# Patient Record
Sex: Male | Born: 1935 | Race: White | Hispanic: No | Marital: Married | Smoking: Former smoker
Health system: Southern US, Community
[De-identification: ages and names within clinical notes are randomized; demographics above are authoritative.]

## PROBLEM LIST (undated history)

## (undated) DIAGNOSIS — I10 Essential (primary) hypertension: Secondary | ICD-10-CM

## (undated) DIAGNOSIS — I509 Heart failure, unspecified: Secondary | ICD-10-CM

## (undated) DIAGNOSIS — C439 Malignant melanoma of skin, unspecified: Secondary | ICD-10-CM

## (undated) DIAGNOSIS — J449 Chronic obstructive pulmonary disease, unspecified: Secondary | ICD-10-CM

## (undated) HISTORY — PX: TONSILLECTOMY: SUR1361

## (undated) HISTORY — PX: APPENDECTOMY: SHX54

## (undated) HISTORY — PX: COLON SURGERY: SHX602

---

## 2006-12-07 ENCOUNTER — Ambulatory Visit: Payer: Self-pay | Admitting: Gastroenterology

## 2007-09-08 ENCOUNTER — Ambulatory Visit: Payer: Self-pay | Admitting: Internal Medicine

## 2007-09-20 ENCOUNTER — Ambulatory Visit: Payer: Self-pay | Admitting: Internal Medicine

## 2007-10-03 ENCOUNTER — Ambulatory Visit: Payer: Self-pay | Admitting: Internal Medicine

## 2007-11-24 ENCOUNTER — Ambulatory Visit: Payer: Self-pay | Admitting: Anesthesiology

## 2007-12-06 ENCOUNTER — Ambulatory Visit: Payer: Self-pay | Admitting: Anesthesiology

## 2008-01-08 ENCOUNTER — Ambulatory Visit: Payer: Self-pay | Admitting: Anesthesiology

## 2008-02-08 ENCOUNTER — Ambulatory Visit: Payer: Self-pay | Admitting: Anesthesiology

## 2009-02-27 ENCOUNTER — Emergency Department: Payer: Self-pay | Admitting: Emergency Medicine

## 2009-08-11 ENCOUNTER — Ambulatory Visit: Payer: Self-pay | Admitting: Internal Medicine

## 2009-10-22 ENCOUNTER — Ambulatory Visit: Payer: Self-pay | Admitting: Anesthesiology

## 2009-11-11 ENCOUNTER — Ambulatory Visit: Payer: Self-pay | Admitting: Anesthesiology

## 2009-12-04 ENCOUNTER — Ambulatory Visit: Payer: Self-pay | Admitting: Anesthesiology

## 2009-12-31 ENCOUNTER — Ambulatory Visit: Payer: Self-pay | Admitting: Gastroenterology

## 2010-01-05 ENCOUNTER — Ambulatory Visit: Payer: Self-pay | Admitting: Anesthesiology

## 2010-02-05 ENCOUNTER — Ambulatory Visit: Payer: Self-pay | Admitting: Anesthesiology

## 2010-03-31 ENCOUNTER — Ambulatory Visit: Payer: Self-pay | Admitting: Anesthesiology

## 2010-05-04 ENCOUNTER — Ambulatory Visit: Payer: Self-pay | Admitting: Internal Medicine

## 2010-05-12 ENCOUNTER — Ambulatory Visit: Payer: Self-pay | Admitting: Internal Medicine

## 2010-08-26 ENCOUNTER — Ambulatory Visit: Payer: Self-pay | Admitting: Internal Medicine

## 2013-01-24 ENCOUNTER — Ambulatory Visit: Payer: Self-pay | Admitting: Gastroenterology

## 2013-01-25 ENCOUNTER — Ambulatory Visit: Payer: Self-pay | Admitting: Gastroenterology

## 2013-01-26 LAB — PATHOLOGY REPORT

## 2014-01-23 ENCOUNTER — Inpatient Hospital Stay: Payer: Self-pay | Admitting: Internal Medicine

## 2014-01-23 LAB — CK-MB
CK-MB: 2 ng/mL (ref 0.5–3.6)
CK-MB: 5.9 ng/mL — AB (ref 0.5–3.6)
CK-MB: 6.1 ng/mL — AB (ref 0.5–3.6)

## 2014-01-23 LAB — CBC
HCT: 47.6 % (ref 40.0–52.0)
HGB: 15.9 g/dL (ref 13.0–18.0)
MCH: 30.2 pg (ref 26.0–34.0)
MCHC: 33.4 g/dL (ref 32.0–36.0)
MCV: 90 fL (ref 80–100)
Platelet: 145 10*3/uL — ABNORMAL LOW (ref 150–440)
RBC: 5.27 10*6/uL (ref 4.40–5.90)
RDW: 12.9 % (ref 11.5–14.5)
WBC: 14.7 10*3/uL — ABNORMAL HIGH (ref 3.8–10.6)

## 2014-01-23 LAB — TROPONIN I
Troponin-I: 0.27 ng/mL — ABNORMAL HIGH
Troponin-I: 0.85 ng/mL — ABNORMAL HIGH
Troponin-I: 0.92 ng/mL — ABNORMAL HIGH

## 2014-01-23 LAB — BASIC METABOLIC PANEL
Anion Gap: 14 (ref 7–16)
BUN: 13 mg/dL (ref 7–18)
CHLORIDE: 101 mmol/L (ref 98–107)
CO2: 18 mmol/L — AB (ref 21–32)
Calcium, Total: 8.3 mg/dL — ABNORMAL LOW (ref 8.5–10.1)
Creatinine: 0.96 mg/dL (ref 0.60–1.30)
EGFR (African American): >60
Glucose: 138 mg/dL — ABNORMAL HIGH (ref 65–99)
Osmolality: 269 (ref 275–301)
POTASSIUM: 3.9 mmol/L (ref 3.5–5.1)
Sodium: 133 mmol/L — ABNORMAL LOW (ref 136–145)

## 2014-01-23 LAB — RAPID INFLUENZA A&B ANTIGENS

## 2014-01-24 DIAGNOSIS — I369 Nonrheumatic tricuspid valve disorder, unspecified: Secondary | ICD-10-CM

## 2014-01-24 LAB — BASIC METABOLIC PANEL
Anion Gap: 5 — ABNORMAL LOW (ref 7–16)
BUN: 17 mg/dL (ref 7–18)
Calcium, Total: 8.5 mg/dL (ref 8.5–10.1)
Chloride: 102 mmol/L (ref 98–107)
Co2: 27 mmol/L (ref 21–32)
Creatinine: 1 mg/dL (ref 0.60–1.30)
EGFR (African American): >60
EGFR (Non-African Amer.): >60
Glucose: 150 mg/dL — ABNORMAL HIGH (ref 65–99)
Osmolality: 273 (ref 275–301)
Potassium: 4.1 mmol/L (ref 3.5–5.1)
Sodium: 134 mmol/L — ABNORMAL LOW (ref 136–145)

## 2014-01-24 LAB — CBC WITH DIFFERENTIAL/PLATELET
BASOS ABS: 0 10*3/uL (ref 0.0–0.1)
BASOS PCT: 0 %
EOS ABS: 0 10*3/uL (ref 0.0–0.7)
EOS PCT: 0 %
HCT: 42 % (ref 40.0–52.0)
HGB: 14.5 g/dL (ref 13.0–18.0)
LYMPHS PCT: 6.2 %
Lymphocyte #: 1.1 10*3/uL (ref 1.0–3.6)
MCH: 30.7 pg (ref 26.0–34.0)
MCHC: 34.4 g/dL (ref 32.0–36.0)
MCV: 89 fL (ref 80–100)
MONO ABS: 0.4 x10 3/mm (ref 0.2–1.0)
Monocyte %: 2 %
NEUTROS ABS: 16.5 10*3/uL — AB (ref 1.4–6.5)
NEUTROS PCT: 91.8 %
PLATELETS: 141 10*3/uL — AB (ref 150–440)
RBC: 4.71 10*6/uL (ref 4.40–5.90)
RDW: 13 % (ref 11.5–14.5)
WBC: 18 10*3/uL — ABNORMAL HIGH (ref 3.8–10.6)

## 2014-01-24 LAB — MAGNESIUM: Magnesium: 1.7 mg/dL — ABNORMAL LOW

## 2014-01-25 LAB — BASIC METABOLIC PANEL
Anion Gap: 8 (ref 7–16)
BUN: 26 mg/dL — AB (ref 7–18)
Calcium, Total: 8.2 mg/dL — ABNORMAL LOW (ref 8.5–10.1)
Chloride: 102 mmol/L (ref 98–107)
Co2: 25 mmol/L (ref 21–32)
Creatinine: 1.08 mg/dL (ref 0.60–1.30)
EGFR (Non-African Amer.): >60
GLUCOSE: 148 mg/dL — AB (ref 65–99)
Osmolality: 278 (ref 275–301)
POTASSIUM: 3.9 mmol/L (ref 3.5–5.1)
Sodium: 135 mmol/L — ABNORMAL LOW (ref 136–145)

## 2014-01-25 LAB — CBC
HCT: 43.5 % (ref 40.0–52.0)
HGB: 14.5 g/dL (ref 13.0–18.0)
MCH: 29.8 pg (ref 26.0–34.0)
MCHC: 33.2 g/dL (ref 32.0–36.0)
MCV: 90 fL (ref 80–100)
Platelet: 177 10*3/uL (ref 150–440)
RBC: 4.86 10*6/uL (ref 4.40–5.90)
RDW: 13.3 % (ref 11.5–14.5)
WBC: 19.5 10*3/uL — AB (ref 3.8–10.6)

## 2014-01-26 LAB — WBC: WBC: 12.9 10*3/uL — AB (ref 3.8–10.6)

## 2014-01-26 LAB — BASIC METABOLIC PANEL
ANION GAP: 3 — AB (ref 7–16)
BUN: 33 mg/dL — ABNORMAL HIGH (ref 7–18)
CALCIUM: 7.9 mg/dL — AB (ref 8.5–10.1)
CO2: 28 mmol/L (ref 21–32)
Chloride: 105 mmol/L (ref 98–107)
Creatinine: 1.02 mg/dL (ref 0.60–1.30)
EGFR (African American): >60
EGFR (Non-African Amer.): >60
Glucose: 163 mg/dL — ABNORMAL HIGH (ref 65–99)
Osmolality: 283 (ref 275–301)
Potassium: 4.1 mmol/L (ref 3.5–5.1)
Sodium: 136 mmol/L (ref 136–145)

## 2014-01-28 LAB — CULTURE, BLOOD (SINGLE)

## 2014-09-29 ENCOUNTER — Inpatient Hospital Stay: Payer: Self-pay | Admitting: Internal Medicine

## 2014-09-29 LAB — CK TOTAL AND CKMB (NOT AT ARMC)
CK, TOTAL: 46 U/L
CK, Total: 54 U/L
CK, Total: 51 U/L
CK-MB: 0.7 ng/mL (ref 0.5–3.6)
CK-MB: 0.8 ng/mL (ref 0.5–3.6)
CK-MB: 0.9 ng/mL (ref 0.5–3.6)

## 2014-09-29 LAB — COMPREHENSIVE METABOLIC PANEL
ALBUMIN: 3.4 g/dL (ref 3.4–5.0)
ALT: 28 U/L
Alkaline Phosphatase: 73 U/L
Anion Gap: 6 — ABNORMAL LOW (ref 7–16)
BUN: 14 mg/dL (ref 7–18)
Bilirubin,Total: 1 mg/dL (ref 0.2–1.0)
CALCIUM: 8.3 mg/dL — AB (ref 8.5–10.1)
CO2: 29 mmol/L (ref 21–32)
Chloride: 101 mmol/L (ref 98–107)
Creatinine: 1.2 mg/dL (ref 0.60–1.30)
EGFR (Non-African Amer.): >60
Glucose: 136 mg/dL — ABNORMAL HIGH (ref 65–99)
Osmolality: 275 (ref 275–301)
Potassium: 4 mmol/L (ref 3.5–5.1)
SGOT(AST): 28 U/L (ref 15–37)
SODIUM: 136 mmol/L (ref 136–145)
TOTAL PROTEIN: 6.8 g/dL (ref 6.4–8.2)

## 2014-09-29 LAB — CBC
HCT: 50.1 % (ref 40.0–52.0)
HGB: 16 g/dL (ref 13.0–18.0)
MCH: 29 pg (ref 26.0–34.0)
MCHC: 31.9 g/dL — ABNORMAL LOW (ref 32.0–36.0)
MCV: 91 fL (ref 80–100)
Platelet: 181 10*3/uL (ref 150–440)
RBC: 5.51 10*6/uL (ref 4.40–5.90)
RDW: 15.8 % — ABNORMAL HIGH (ref 11.5–14.5)
WBC: 11.5 10*3/uL — ABNORMAL HIGH (ref 3.8–10.6)

## 2014-09-29 LAB — TROPONIN I
Troponin-I: <0.02 ng/mL
Troponin-I: <0.02 ng/mL

## 2014-09-29 LAB — PRO B NATRIURETIC PEPTIDE: B-TYPE NATIURETIC PEPTID: 288 pg/mL (ref 0–450)

## 2014-09-30 LAB — CBC WITH DIFFERENTIAL/PLATELET
Basophil #: 0 10*3/uL (ref 0.0–0.1)
Basophil %: 0.1 %
Eosinophil #: 0 10*3/uL (ref 0.0–0.7)
Eosinophil %: 0 %
HCT: 46.2 % (ref 40.0–52.0)
HGB: 15.1 g/dL (ref 13.0–18.0)
Lymphocyte #: 1.3 10*3/uL (ref 1.0–3.6)
Lymphocyte %: 8.4 %
MCH: 29.1 pg (ref 26.0–34.0)
MCHC: 32.7 g/dL (ref 32.0–36.0)
MCV: 89 fL (ref 80–100)
Monocyte #: 0.5 x10 3/mm (ref 0.2–1.0)
Monocyte %: 3.6 %
NEUTROS ABS: 13.2 10*3/uL — AB (ref 1.4–6.5)
Neutrophil %: 87.9 %
Platelet: 186 10*3/uL (ref 150–440)
RBC: 5.18 10*6/uL (ref 4.40–5.90)
RDW: 15.6 % — ABNORMAL HIGH (ref 11.5–14.5)
WBC: 15.1 10*3/uL — ABNORMAL HIGH (ref 3.8–10.6)

## 2014-09-30 LAB — BASIC METABOLIC PANEL
ANION GAP: 8 (ref 7–16)
BUN: 14 mg/dL (ref 7–18)
CHLORIDE: 103 mmol/L (ref 98–107)
CO2: 26 mmol/L (ref 21–32)
Calcium, Total: 8.1 mg/dL — ABNORMAL LOW (ref 8.5–10.1)
Creatinine: 1.1 mg/dL (ref 0.60–1.30)
Glucose: 130 mg/dL — ABNORMAL HIGH (ref 65–99)
Osmolality: 276 (ref 275–301)
Potassium: 4.3 mmol/L (ref 3.5–5.1)
Sodium: 137 mmol/L (ref 136–145)

## 2014-10-01 LAB — CBC WITH DIFFERENTIAL/PLATELET
Basophil #: 0 10*3/uL (ref 0.0–0.1)
Basophil %: 0.1 %
EOS ABS: 0 10*3/uL (ref 0.0–0.7)
Eosinophil %: 0 %
HCT: 43.9 % (ref 40.0–52.0)
HGB: 14.1 g/dL (ref 13.0–18.0)
LYMPHS PCT: 5.8 %
Lymphocyte #: 1.1 10*3/uL (ref 1.0–3.6)
MCH: 28.7 pg (ref 26.0–34.0)
MCHC: 32 g/dL (ref 32.0–36.0)
MCV: 90 fL (ref 80–100)
MONO ABS: 0.4 x10 3/mm (ref 0.2–1.0)
Monocyte %: 2.1 %
NEUTROS ABS: 17.1 10*3/uL — AB (ref 1.4–6.5)
Neutrophil %: 92 %
Platelet: 193 10*3/uL (ref 150–440)
RBC: 4.9 10*6/uL (ref 4.40–5.90)
RDW: 15.5 % — AB (ref 11.5–14.5)
WBC: 18.6 10*3/uL — AB (ref 3.8–10.6)

## 2014-10-02 LAB — CBC WITH DIFFERENTIAL/PLATELET
BASOS PCT: 0.1 %
Basophil #: 0 10*3/uL (ref 0.0–0.1)
Eosinophil #: 0 10*3/uL (ref 0.0–0.7)
Eosinophil %: 0 %
HCT: 44.5 % (ref 40.0–52.0)
HGB: 14 g/dL (ref 13.0–18.0)
LYMPHS ABS: 0.8 10*3/uL — AB (ref 1.0–3.6)
Lymphocyte %: 5 %
MCH: 28.5 pg (ref 26.0–34.0)
MCHC: 31.4 g/dL — ABNORMAL LOW (ref 32.0–36.0)
MCV: 91 fL (ref 80–100)
MONO ABS: 0.5 x10 3/mm (ref 0.2–1.0)
MONOS PCT: 3 %
Neutrophil #: 14.6 10*3/uL — ABNORMAL HIGH (ref 1.4–6.5)
Neutrophil %: 91.9 %
Platelet: 208 10*3/uL (ref 150–440)
RBC: 4.91 10*6/uL (ref 4.40–5.90)
RDW: 15.5 % — ABNORMAL HIGH (ref 11.5–14.5)
WBC: 15.9 10*3/uL — ABNORMAL HIGH (ref 3.8–10.6)

## 2014-10-02 LAB — MAGNESIUM: MAGNESIUM: 1.9 mg/dL

## 2014-10-03 LAB — BASIC METABOLIC PANEL
Anion Gap: 6 — ABNORMAL LOW (ref 7–16)
BUN: 24 mg/dL — AB (ref 7–18)
CO2: 30 mmol/L (ref 21–32)
Calcium, Total: 7.9 mg/dL — ABNORMAL LOW (ref 8.5–10.1)
Chloride: 100 mmol/L (ref 98–107)
Creatinine: 0.99 mg/dL (ref 0.60–1.30)
EGFR (African American): >60
Glucose: 145 mg/dL — ABNORMAL HIGH (ref 65–99)
Osmolality: 279 (ref 275–301)
Potassium: 4 mmol/L (ref 3.5–5.1)
Sodium: 136 mmol/L (ref 136–145)

## 2014-10-04 LAB — CULTURE, BLOOD (SINGLE)

## 2015-01-14 ENCOUNTER — Inpatient Hospital Stay: Payer: Self-pay | Admitting: Internal Medicine

## 2015-01-14 LAB — CBC
HCT: 48.6 % (ref 40.0–52.0)
HGB: 16 g/dL (ref 13.0–18.0)
MCH: 29.4 pg (ref 26.0–34.0)
MCHC: 32.8 g/dL (ref 32.0–36.0)
MCV: 90 fL (ref 80–100)
PLATELETS: 192 10*3/uL (ref 150–440)
RBC: 5.43 10*6/uL (ref 4.40–5.90)
RDW: 14.4 % (ref 11.5–14.5)
WBC: 10.8 10*3/uL — AB (ref 3.8–10.6)

## 2015-01-14 LAB — BASIC METABOLIC PANEL
Anion Gap: 6 — ABNORMAL LOW (ref 7–16)
BUN: 13 mg/dL (ref 7–18)
CALCIUM: 8.8 mg/dL (ref 8.5–10.1)
Chloride: 99 mmol/L (ref 98–107)
Co2: 30 mmol/L (ref 21–32)
Creatinine: 1.12 mg/dL (ref 0.60–1.30)
GLUCOSE: 148 mg/dL — AB (ref 65–99)
Osmolality: 273 (ref 275–301)
Potassium: 4.1 mmol/L (ref 3.5–5.1)
Sodium: 135 mmol/L — ABNORMAL LOW (ref 136–145)

## 2015-01-14 LAB — TROPONIN I: Troponin-I: <0.02 ng/mL

## 2015-01-15 LAB — CBC WITH DIFFERENTIAL/PLATELET
BASOS PCT: 0.1 %
Basophil #: 0 10*3/uL (ref 0.0–0.1)
EOS PCT: 0 %
Eosinophil #: 0 10*3/uL (ref 0.0–0.7)
HCT: 45.8 % (ref 40.0–52.0)
HGB: 14.8 g/dL (ref 13.0–18.0)
Lymphocyte #: 0.8 10*3/uL — ABNORMAL LOW (ref 1.0–3.6)
Lymphocyte %: 11 %
MCH: 28.8 pg (ref 26.0–34.0)
MCHC: 32.4 g/dL (ref 32.0–36.0)
MCV: 89 fL (ref 80–100)
Monocyte #: 0.1 x10 3/mm — ABNORMAL LOW (ref 0.2–1.0)
Monocyte %: 1.7 %
NEUTROS PCT: 87.2 %
Neutrophil #: 6.4 10*3/uL (ref 1.4–6.5)
Platelet: 185 10*3/uL (ref 150–440)
RBC: 5.14 10*6/uL (ref 4.40–5.90)
RDW: 14.1 % (ref 11.5–14.5)
WBC: 7.3 10*3/uL (ref 3.8–10.6)

## 2015-01-15 LAB — BASIC METABOLIC PANEL
Anion Gap: 9 (ref 7–16)
BUN: 15 mg/dL (ref 7–18)
CHLORIDE: 100 mmol/L (ref 98–107)
CREATININE: 1.09 mg/dL (ref 0.60–1.30)
Calcium, Total: 8.8 mg/dL (ref 8.5–10.1)
Co2: 27 mmol/L (ref 21–32)
EGFR (African American): >60
EGFR (Non-African Amer.): >60
GLUCOSE: 142 mg/dL — AB (ref 65–99)
Osmolality: 275 (ref 275–301)
Potassium: 4.3 mmol/L (ref 3.5–5.1)
SODIUM: 136 mmol/L (ref 136–145)

## 2015-01-15 LAB — MAGNESIUM: MAGNESIUM: 2 mg/dL

## 2015-02-22 ENCOUNTER — Emergency Department: Payer: Self-pay | Admitting: Emergency Medicine

## 2015-03-22 ENCOUNTER — Emergency Department
Admit: 2015-03-22 | Disposition: A | Discharge status: Home / Self Care | Payer: Self-pay | Admitting: Emergency Medicine

## 2015-03-22 LAB — BASIC METABOLIC PANEL
Anion Gap: 9 (ref 7–16)
BUN: 16 mg/dL
CALCIUM: 8.6 mg/dL — AB
Chloride: 100 mmol/L — ABNORMAL LOW
Co2: 26 mmol/L
Creatinine: 0.99 mg/dL
EGFR (African American): >60
GLUCOSE: 134 mg/dL — AB
Potassium: 3.6 mmol/L
SODIUM: 135 mmol/L

## 2015-03-22 LAB — CBC
HCT: 43.6 % (ref 40.0–52.0)
HGB: 15.3 g/dL (ref 13.0–18.0)
MCH: 32.7 pg (ref 26.0–34.0)
MCHC: 35.1 g/dL (ref 32.0–36.0)
MCV: 93 fL (ref 80–100)
Platelet: 234 10*3/uL (ref 150–440)
RBC: 4.68 10*6/uL (ref 4.40–5.90)
RDW: 16 % — ABNORMAL HIGH (ref 11.5–14.5)
WBC: 6.3 10*3/uL (ref 3.8–10.6)

## 2015-03-22 LAB — PRO B NATRIURETIC PEPTIDE: B-Type Natriuretic Peptide: 134 pg/mL — ABNORMAL HIGH

## 2015-03-22 LAB — TROPONIN I

## 2015-04-12 NOTE — Consult Note (Signed)
PATIENT NAME:  Brett Burton, Brett Burton MR#:  [id number] DATE OF BIRTH:  08/07/1929  DATE OF CONSULTATION:  09/29/2014  CONSULTING PHYSICIAN:    Dyann Ruddle A. Dorena Cookey, PA-C for Dionisio David, MD  INDICATION FOR CONSULTATION: Shortness of breath.  HISTORY OF PRESENT ILLNESS:  This is a pleasant 81 year old male known to our practice, who presented to the ER this morning with shortness of breath and cough x 3-4 days, which he relates to getting his flu shot on Wednesday.  He denies chest pain, palpitations, tachycardia, orthopnea, PND, or edema.  He admits to being compliant with all medications prescribed.    PAST MEDICAL HISTORY:  Coronary artery disease diagnosed in February 2015, currently managed medically, atrial fibrillation, mildly decreased left ventricular systolic function, COPD, hypertension and hyperlipidemia.    SOCIAL HISTORY:  The patient admits to EtOH abuse, quit 50 years ago.  He also admits to smoking cigarettes, quit 20 years ago. He admits to currently using smokeless tobacco occasionally. He denies any other illicit drug use.  FAMILY HISTORY:  Positive for coronary artery disease in father and 3 brothers who all had myocardial infarctions in their 85s.    REVIEW OF SYSTEMS:  CONSTITUTIONAL:  No fatigue, malaise, or dizziness. RESPIRATORY:  He denies current shortness of breath or cough.  CARDIOVASCULAR:  He denies chest pain, pressure, tightness, palpitations, orthopnea or PND.  ABDOMINAL:  No abdominal pain.  PHYSICAL EXAMINATION:  VITAL SIGNS:  Temperature 98.1, pulse 72, respirations 18, blood pressure 151/79.  Pulse oximetry 88% on 6 liters nasal cannula.   GENERAL:  The patient is alert and oriented x 3 in no acute distress.  Nasal cannula in place, sitting comfortably in bed.   RESPIRATORY:  Decreased air entry, mild wheezes and rhonchi bilaterally. No crackles.  HEART:  Normal S1, S2, no audible murmur. ABDOMEN:  No hepatomegaly.  No abdominal tenderness.  Positive bowel  sounds.   EXTREMITIES:  No pedal edema.    LABORATORY DATA: BNP of 288, creatinine 1.2, troponin negative x 2.   EKG shows normal sinus rhythm, 69 beats per minute, LAE, nonspecific ST-T and T changes.   ASSESSMENT AND PLAN:  Shortness of breath: Await echocardiogram results.  The patient had echocardiogram done in August and May of 2015 which shows ejection fraction of 89%, grade 1 diastolic dysfunction, mild mitral regurgitation, tricuspid regurgitation, mild to moderate pulmonary insufficiency.  Shortness of breath more likely secondary to chronic obstructive pulmonary disease as BNP, troponins and physical examination were negative for congestive heart failure symptoms besides shortness of breath.   Will follow.  Thank you very much for this consultation.   ____________________________ Kelby Fam [place] ear:LT D: 09/29/2014 16:20:58 ET T: 09/29/2014 17:35:16 ET JOB#: [id number]  cc: Dyann Ruddle A. Baldwin [place], <Dictator> Kelby Fam Latona Krichbaum PA ELECTRONICALLY SIGNED 10/02/2014 12:31

## 2015-04-12 NOTE — Discharge Summary (Signed)
PATIENT NAME:  Brett Burton, Brett Burton MR#:  [id number] DATE OF BIRTH:  Jan 20, 1929  DATE OF ADMISSION:  09/29/2014 DATE OF DISCHARGE:  10/04/2014  DISCHARGE DIAGNOSES:  1.  Acute on chronic respiratory failure.  2.  Chronic obstructive pulmonary disease exacerbation.  3.  Acute on chronic diastolic congestive heart failure.  4.  Hypertension.  5.  Paroxysmal atrial fibrillation.   CONSULTATIONS: Neoma Laming, MD with cardiology.   DIAGNOSTIC DATA:  1.  Chest x-ray showed pulmonary edema bilaterally. 2.  Echocardiogram showed ejection fraction of 50-55% with infarct of the inferior segment and mid inferior segment abnormal, impaired relaxation pattern suggesting diastolic failure.   ADMITTING HISTORY AND PHYSICAL: Please see detailed H and P dictated by Dr. Doy Hutching. In brief, this is an 81 year old male patient brought into the hospital complaining of acute on chronic shortness of breath. The patient was found to have COPD exacerbation along with CHF and was admitted to the hospitalist service.   HOSPITAL COURSE: Acute on chronic respiratory failure secondary to chronic obstructive pulmonary disease exacerbation and congestive heart failure exacerbation. The patient was started on scheduled nebulizers, IV steroids and oxygen support, with which he improved well. His wheezing disappeared, but the patient breathing did not improve completely. The patient was later started on Lasix 40 IV b.i.d. with which he diuresed well, and his oxygen requirements have come down from 6 L to 3 L by the time of discharge.   Prior to discharge, the patient's lungs sound clear. No wheezing, no crackles, no edema. The patient feels back to baseline, and has requested to be discharged home. He was discharged home in a stable condition.   DISCHARGE MEDICATIONS:  1.  Combivent inhaled 3 times a day as needed.  2.  Calcium carbonate 500 mg 2 times a day.  3.  Isosorbide mononitrate 30 mg daily.  4.  Amiodarone 200 mg daily.   5.  Simvastatin 20 mg daily.  6.  Aspirin 325 mg daily.  7.  Plavix 75 mg daily.  8.  Fluticasone salmeterol 500/50 mg 1 puff b.i.d.  9.  Spiriva 12 mcg inhaled once a day.  10.  Amlodipine 5 mg daily.  11.  Coreg 3.125 mg 2 times a day.  12.  Lasix 40 mg daily.  13.  Potassium chloride 10 mEq daily.  14.  Prednisone 60 mg taper over 6 days.   DISCHARGE INSTRUCTIONS: Home oxygen continuously 3 L through nasal cannula. Low-sodium, low-fat diet. Daily fluids less than 2 L. Activity as tolerated. Follow up with primary care physician, Dr. Thereasa Distance, in 1-2 weeks. The patient has been instructed to check his weight every day, keep a log and take an extra dose of Lasix if he gains more than 3 pounds.   TIME SPENT ON DAY OF DISCHARGE IN DISCHARGE ACTIVITY: 40 minutes    ____________________________ Leia Alf. Tkai Large, MD srs:MT D: 10/07/2014 13:55:05 ET T: 10/07/2014 15:42:40 ET JOB#: [id number]  cc: Alveta Heimlich R. Darvin Neighbours, MD, <Dictator> Dionisio David, MD Sofie Hartigan, MD Neita Carp MD ELECTRONICALLY SIGNED 10/16/2014 10:44

## 2015-04-12 NOTE — Consult Note (Signed)
PATIENT NAME:  Brett Burton, Brett Burton MR#:  [id number] DATE OF BIRTH:  1929/02/12  CARDIOLOGY CONSULTATION   DATE OF CONSULTATION:  01/24/2014  CONSULTING PHYSICIAN:  Dionisio David, MD  INDICATION FOR CONSULTATION: Elevated troponin and atrial fibrillation.   HISTORY OF PRESENT ILLNESS: This is an 81 year old white male with a past medical history of COPD, who, according to the son and the patient, has been feeling short of breath with URI type of symptoms for 2 or 3 weeks. Yesterday, all of a sudden, developed severe shortness of breath to the point he could not sleep. Thus, he called ambulance and was brought to the hospital. In the hospital, he was treated for possible CHF and pneumonia, and incidental finding, his troponin initially was 0.37 and then went up to 0.92. Thus, I was asked to evaluate the patient. The patient denies any chest pain, but has been having severe shortness of breath.   PAST MEDICAL HISTORY: He has a history of hypertension, history of COPD. No history of prior coronary artery disease, CHF or any kind of cardiac problem.   SOCIAL HISTORY: Quit smoking a long time ago. No history of EtOH abuse.   FAMILY HISTORY: Positive for coronary artery disease and MI. Father had MI.   PHYSICAL EXAMINATION:  GENERAL: He is alert, oriented x3, in mild distress due to shortness of breath.  VITAL SIGNS: He is on monitor, and he is showing atrial fibrillation, heart rate 120, respirations are 18, blood pressure is 130/70.  NECK: Revealed positive JVD.  LUNGS: Good air entry, occasional wheezing.   HEART: Irregularly irregular. Normal S1, S2. No audible murmur.  ABDOMEN: Soft, nontender, positive bowel sounds.  EXTREMITIES: No pedal edema.   DIAGNOSTIC STUDIES: EKG on admission showed sinus tachycardia, left atrial enlargement, right axis deviation, evidence of COPD. Troponin was 0.92. BUN and creatinine appear to be normal.   ASSESSMENT AND PLAN: The patient has elevated troponin,  congestive heart failure and atrial fibrillation. Repeat EKG shows atrial fibrillation with rapid ventricular response rate.   PLAN: Start the patient on IV amiodarone as his EKG shows prolonged QTc interval. Also, will replace magnesium with 2 grams magnesium IV. Also, because of elevated troponin, probably has coronary artery disease. Schedule cardiac catheterization.   Thank you very much for referral.   ____________________________ Dionisio David, MD sak:lb D: 01/24/2014 15:02:51 ET T: 01/24/2014 15:12:00 ET JOB#: [id number]  cc: Dionisio David, MD, <Dictator> Dionisio David MD ELECTRONICALLY SIGNED 02/01/2014 8:39

## 2015-04-12 NOTE — H&P (Signed)
PATIENT NAME:  Brett Burton, Brett Burton MR#:  [id number] DATE OF BIRTH:  1929-05-14  DATE OF ADMISSION:  09/29/2014  REFERRING PHYSICIAN: Latina Craver, M.D.   FAMILY PHYSICIAN: Sofie Hartigan, M.D. in [place].  REASON FOR ADMISSION: Acute on chronic respiratory failure.   HISTORY OF PRESENT ILLNESS: The patient is an 81 year old male with a significant history of COPD on oxygen at home. Also has a history of 2-vessel coronary artery disease being medically managed, hypertension, and a remote history of colon cancer. Presents to the Emergency Room with a 2 to 3 day history of worsening shortness of breath. In the Emergency Room, the patient's oxygen requirements were 4 to 5 liters of oxygen with exertion. The patient denies chest pain. Chest x-ray done in the ER suggests CHF, although the patient's BNP is normal. Denies history of heart failure. On amiodarone for unknown reasons as he denies any cardiac arrhythmia. He is now admitted for further evaluation.   PAST MEDICAL HISTORY: 1. Two-vessel coronary artery disease being medically managed.  2. Chronic respiratory failure, on oxygen.  3. COPD.  4. Benign hypertension.  5. Hyperlipidemia.  6. Remote history of colon cancer.   MEDICATIONS:  1. Spiriva 1 capsule inhaled daily.  2. Crestor 20 mg p.o. daily.  3. Imdur 30 mg p.o. daily.  4. Advair 500/50 one puff b.i.d.  5. Combivent 1 puff t.i.d.  6. Plavix 75 mg p.o. daily.  7. Coreg 3.125 mg p.o. b.i.d.  8. Aspirin 325 mg p.o. daily.  9. Amlodipine 5 mg p.o. daily.  10. Amiodarone 200 mg p.o. daily.   ALLERGIES: No known drug allergies.   SOCIAL HISTORY: The patient is a former smoker. Denies alcohol abuse.   FAMILY HISTORY: Positive for coronary artery disease, but otherwise unremarkable.   REVIEW OF SYSTEMS:  CONSTITUTIONAL: No fever or change in weight.  EYES: No blurred or double vision. No glaucoma.  ENT: No tinnitus or hearing loss. No nasal discharge or bleeding. No difficulty  swallowing.  RESPIRATORY: The patient has had cough, but denies wheezing or hemoptysis. No painful respiration.  CARDIOVASCULAR: No chest pain or orthopnea. No palpitations or syncope.  GASTROINTESTINAL: No nausea, vomiting, or diarrhea. Denies abdominal pain.  GENITOURINARY: No dysuria or hematuria. No incontinence.  ENDOCRINE: No polyuria or polydipsia. No heat or cold intolerance.  HEMATOLOGIC: The patient denies anemia, easy bruising, or bleeding.  LYMPHATIC: No swollen glands.  MUSCULOSKELETAL: The patient denies pain in his neck, back, shoulders, knees, or hips. No gout.  NEUROLOGIC: No numbness or migraines. Denies stroke or seizures.  PSYCHIATRIC: The patient denies anxiety, insomnia or depression.   PHYSICAL EXAMINATION:  GENERAL: The patient is in no acute distress.  VITAL SIGNS: Remarkable for a blood pressure of 158/76, heart rate 72, respiratory rate 22, temperature 98.1, saturation 82% on room air, saturations 98% on 4 liters.  HEENT: Normocephalic, atraumatic. Pupils equal, round, and reactive to light and accommodation. Extraocular movements are intact. Sclerae anicteric. Conjunctivae are clear. Oropharynx is clear.  NECK: Supple without JVD. No adenopathy or thyromegaly is noted.  LUNGS: Revealed scattered rhonchi without wheezes or rales. No dullness. Respiratory effort is mildly increased.  CARDIAC: Regular rate and rhythm. Normal S1, S2. No significant rubs, murmurs or gallops. PMI is nondisplaced. Chest wall is nontender.  ABDOMEN: Soft, nontender, with normoactive bowel sounds. No organomegaly or masses were appreciated. No hernias or bruits were noted.  EXTREMITIES: Without clubbing, cyanosis or edema. Pulses were 2+ bilaterally.  SKIN: Warm and dry without  rash or lesions.  NEUROLOGIC: Cranial nerves II through XII grossly intact. Deep tendon reflexes were symmetric. Motor and sensory examination is nonfocal.  PSYCHIATRIC: Revealed a patient who is alert and oriented to  person, place and time. He was cooperative and used good judgment.   LABORATORY DATA: EKG revealed sinus rhythm with a right bundle branch block, but no acute ischemic changes.   Chest x-ray revealed moderate CHF superimposed on COPD with diffuse interstitial pulmonary edema and small bilateral pleural effusions.   His BNP was normal at 288. Troponin less than 0.02. Glucose 136 with a BUN of 14, creatinine 1.20 and a GFR of greater than 60. His white count was 11.5 with a hemoglobin of 16.   ASSESSMENT:  1. Acute on chronic respiratory failure.  2. Chronic obstructive pulmonary disease exacerbation.  3. Questionable presumably diastolic congestive heart failure.  4. Two-vessel coronary artery disease being medically managed.  5. Hyperlipidemia.  6. Benign hypertension.   PLAN: The patient will be admitted to telemetry as a Full Code. We will begin oxygen, IV steroids, IV antibiotics and DuoNeb SVNs. Will obtain a CT of the chest to rule out heart failure given the fact that his examination is unremarkable for congestive heart failure. May have some interstitial lung disease from amiodarone use. We will follow serial cardiac enzymes and obtain an echocardiogram. We will consult pulmonology and cardiology. Wean oxygen as tolerated. Follow his sugars while on steroids. Follow up routine laboratories in the morning.   Further treatment and evaluation will depend upon the patient's progress.   TOTAL TIME SPENT ON THIS PATIENT: 50 minutes.    ____________________________ Leonie Douglas Doy Hutching, MD jds:JT D: 09/29/2014 11:31:45 ET T: 09/29/2014 12:13:42 ET JOB#: [id number]  cc: Leonie Douglas. Doy Hutching, MD, <Dictator> Sofie Hartigan, MD Aneliese Beaudry Lennice Sites MD ELECTRONICALLY SIGNED 09/29/2014 20:28

## 2015-04-12 NOTE — H&P (Signed)
PATIENT NAME:  Brett Burton, Brett Burton MR#:  [id number] DATE OF BIRTH:  Sep 09, 1929  DATE OF ADMISSION:  01/23/2014  REFERRING PHYSICIAN:  Dr. Dahlia Client.   PRIMARY CARE PHYSICIAN:  Dr. Ellison Hughs.   CHIEF COMPLAINT:  Shortness of breath.  HISTORY OF PRESENT ILLNESS:  An 81 year old gentleman with history of COPD on 79 liters nasal cannula at nighttime at baseline, hypertension, hyperlipidemia, presenting with shortness of breath.  Described shortness of breath in total of two weeks duration originally started with URI-like symptoms, however progressively worsening mainly as dyspnea on exertion, acutely worse today.  Said he now has associated fevers, chills about one day duration with cough which is nonproductive in nature.  Denies any chest pain, palpitations, edema, orthopnea or PND.  Currently he is without complaints, however he was hypoxemic on arrival to the Emergency Department requiring supplemental O2.  REVIEW OF SYSTEMS:  CONSTITUTIONAL:  Positive for fevers, chills.  Denies any fatigue, weakness, pain.  EYES:  Denies blurred vision, double vision, eye pain.  EARS, NOSE, THROAT:  Denies tinnitus, ear pain, hearing loss.  RESPIRATORY:  Positive for nonproductive cough as described above as well as dyspnea on exertion; however denies any wheeze.  CARDIOVASCULAR:  Denies chest pain, palpitations, edema.  GASTROINTESTINAL:  Denies nausea, vomiting, diarrhea, abdominal pain.  GENITOURINARY:  Denies dysuria, hematuria.  ENDOCRINE:  Denies nocturia or thyroid problems.  HEMATOLOGY AND LYMPHATIC:  Denies easy bruising, bleeding.  SKIN:  Denies rash or lesion.  MUSCULOSKELETAL:  Denies pain in neck, back, shoulder, knees, hips, arthritic symptoms.  NEUROLOGIC:  Denies paralysis, paresthesias.  PSYCHIATRIC:  Denies anxiety or depressive symptoms.  Otherwise, full review of systems performed by me is negative.   PAST MEDICAL HISTORY:  COPD requiring 2 liters nasal cannula at nighttime only, hypertension,  hyperlipidemia, history of colon cancer as well.   SOCIAL HISTORY:  Remote tobacco abuse, occasional chewing tobacco.  Denies any alcohol use or drug usage.   FAMILY HISTORY:  Denies any known cardiac or pulmonary diseases.   ALLERGIES:  No known drug allergies.   HOME MEDICATIONS:  Include Combivent 3 times daily as needed for shortness of breath, Dulera inhaled 2 times daily, Spiriva inhaled daily, Norvasc 5 mg by mouth daily, aspirin 81 mg by mouth daily, pravastatin 40 mg by mouth daily.   PHYSICAL EXAMINATION: VITAL SIGNS:  Temperature 99, heart rate 110, respirations 24, blood pressure 163/80, saturating 85% on room air.  Weight 77.1 kg, BMI of 24.4.  GENERAL:  Well-nourished, well-developed Caucasian gentleman, currently in no acute distress.  HEAD:  Normocephalic, atraumatic.  EYES:  Pupils equal, round, reactive to light.  Extraocular muscles intact.  No scleral icterus.  MOUTH:  Moist mucosal membranes.  Dentition intact.  No abscess noted.  EAR, NOSE, THROAT:  Throat clear without exudates.  No external lesions.  NECK:  Supple.  No thyromegaly.  No nodules.  No JVD.  PULMONARY:  Diffuse coarse breath sounds with bibasilar rhonchi and prolonged expiratory phase without wheezing.  No use of accessory muscles, tachypneic, good respiratory effort.  CHEST:  Nontender to palpation.  CARDIOVASCULAR:  S1, S2, tachycardic.  No murmurs, rubs, or gallops.  No edema.  Pedal pulses 2+ bilaterally. GASTROINTESTINAL:  Soft, nontender, nondistended.  No masses.  Positive bowel sounds.  No hepatosplenomegaly.  MUSCULOSKELETAL:  No swelling, clubbing, edema.  Range of motion full in all extremities.  NEUROLOGICAL:  Cranial nerves II through XII intact.  No gross neurological deficits.  Sensation intact.  Reflexes intact.  SKIN:  No ulcerations, lesions, rashes, cyanosis.  Skin warm, dry.  Turgor intact.  PSYCHIATRIC:  Mood and affect within normal limits.  The patient is awake, alert and oriented x  3.  Insight and judgment intact.   LABORATORY DATA:  Sinus tachycardia without ST or T wave abnormalities.  Chest x-ray performed revealing hyperinflated lung fields, small effusion on the left lower lobe with compressive atelectasis as well as multilevel interstitial infiltrate.  Sodium 133, potassium 3.9, chloride 101, bicarb 18, anion gap of 14, BUN 13, creatinine 0.96, glucose 138.  Troponin I 0.27.  WBC 14.7, hemoglobin 15.9.   ASSESSMENT AND PLAN:  An 81 year old gentleman with history of chronic obstructive pulmonary disease on supplemental O2 at nighttime presenting with shortness of breath.  1.  Sepsis meeting septic criteria by heart rate, respiratory rate and leukocytosis secondary to community-acquired pneumonia.  Admit, panculture including blood and sputum.  Antibiotic coverage with ceftriaxone and azithromycin.  Provide DuoNeb therapy q. 4 hours, supplemental O2 to keep oxygen saturation greater than 92%, flutter valve therapy.  2.  Elevated troponin I, possible demand ischemia given hypoxemia.  Trend cardiac enzymes.  Give aspirin one now.  3.  Chronic obstructive pulmonary disease, dose Solu-Medrol 60 mg IV daily.  Provide DuoNeb therapy.  Azithromycin as above.  Continue Spiriva and Advair.  4.  Hypertension.  Continue with Norvasc.  5.  Venous thromboembolism prophylaxis with heparin subQ.  6.  CODE STATUS:  THE PATIENT IS A FULL CODE.   TIME SPENT:  45 minutes.     ____________________________ Aaron Mose. Pansy Ostrovsky, MD dkh:ea D: 01/23/2014 03:45:20 ET T: 01/23/2014 04:18:27 ET JOB#: [id number]  cc: Aaron Mose. Meenakshi Sazama, MD, <Dictator> Wilbern Pennypacker Woodfin Ganja MD ELECTRONICALLY SIGNED 01/30/2014 21:16

## 2015-04-12 NOTE — Discharge Summary (Signed)
PATIENT NAME:  Brett Burton, Brett Burton MR#:  [id number] DATE OF BIRTH:  08-19-1929  DATE OF ADMISSION:  01/23/2014 DATE OF DISCHARGE:  01/30/2014  DISCHARGE DIAGNOSES: 1. Chronic obstructive pulmonary disease exacerbation.  2. Sepsis, pneumonia.  3. Acute on chronic respiratory failure.  4. Non-ST-elevation myocardial infarction uncomplicated lesion suggested medical management by cardiology.  5. Atrial fibrillation rate controlled.  6. Hypertension.   CONDITION ON DISCHARGE: Stable.   MEDICATIONS ON DISCHARGE: 1. Combivent 3 times a day as needed for shortness of breath.  2. Spiriva 18 mcg 1 capsule once a day.  3. Prednisone 10 mg, start at 60 mg and taper by 10 mg daily until complete. 4. Calcium carbonate 500 mg 2 tablets 4 times a day as needed for indigestion.  5. Isosorbide mononitrate 30 mg oral extended release once a day.  6. Amiodarone 200 mg oral tablet once a day.  8. Aspirin 325 mg oral delayed-release tablet once a day.  9. Plavix 75 mg oral once a day.  10. Amlodipine 5 mg oral tablet once a day.  11. Augmentin 875 mg oral 2 times a day for 3 days.  12. Carvedilol 3.125 mg oral tablet 2 times a day.  HOME OXYGEN ON DISCHARGE: He was advised to have 4 liters nasal cannula oxygen.   DIET:  Low sodium diet, regular consistency.   ACTIVITY: As tolerated.   TIMEFRAME TO FOLLOW-UP: Within 1 to 2 weeks. Advised to follow with Dr. Devona Konig and Dr. Neoma Laming.   HISTORY OF PRESENT ILLNESS: An 81 year old male with history of chronic obstructive pulmonary disease on 2 liters nasal cannula oxygen at nighttime at baseline. He presented with shortness of breath for the last 2 weeks with upper respiratory infection type symptoms, progressive worsening mainly as dyspnea on exertion. He also had some fever and chills and in the ER requiring supplemental oxygen.   HOSPITAL COURSE AND STAY:  1. The patient had cardiac catheterization because of elevated troponin and was found having 70%  blockage but complicated lesion so advised to have medical management. Dr. Humphrey Rolls spoke to the consultant at [facility] and he suggested to continue medical management for non-ST-elevation myocardial infarction.  2. Sepsis, possibly due to pneumonia. The patient was on broad-spectrum antibiotic and had gradual improvement in the breathing, but still requiring 3 to 4 liters of oxygen at the time of discharge and we discharged with that to rehab. Blood cultures were negative.  3. Chronic obstructive pulmonary disease exacerbation. We continued on IV Solu-Medrol and then later on  we switched it to oral. Also gave Spiriva and nebulizer in the hospital.  4. Atrial fibrillation which was new onset, likely secondary to ischemic heart disease, started on amiodarone drip initially in the hospital and then after stabilization amiodarone was changed to 200 p.o. daily. Cardiology was consulted in the hospital. They held off on further anticoagulation and just continued on aspirin and Plavix as per the cardiac recommendation.  5. Hypertension. We continued Norvasc and blood pressure remained under control.   CONSULTATIONS IN THE HOSPITAL: Cardiology with Dr. Neoma Laming. Pulmonary with Dr. Devona Konig.   Troponin level 0.27 on admission. WBC 14.7, hemoglobin 15.9, platelet count 145, creatinine 0.96, sodium 133, potassium 3.9. Blood culture no growth. Chest x-ray, portable, on admission showed basilar opacity and emphysema. Troponin level went to 0.0192. White cell count rose 18,000.   Echocardiogram showed ejection fraction 50% to 55% with low normal global left ventricular systolic function, borderline concentric left ventricular hypertrophy, wall  motion could not be evaluated, mild dilated left atrium, mild dilated right atrium. CT scan of the chest was done which did not show any evidence of pulmonary embolism and mild bilateral pleural effusion associated with lower lobe segmental atelectasis or scarring and  emphysematous changes.   TOTAL TIME SPENT ON THIS DISCHARGE: 40 minutes.   ____________________________ Ceasar Lund Anselm Jungling, MD vgv:sg D: 02/03/2014 03:56:10 ET T: 02/03/2014 06:28:59 ET JOB#: [id number]  cc: Ceasar Lund. Anselm Jungling, MD, <Dictator>  Vaughan Basta MD ELECTRONICALLY SIGNED 02/09/2014 8:45

## 2015-04-18 ENCOUNTER — Inpatient Hospital Stay
Admission: EM | Admit: 2015-04-18 | Discharge: 2015-04-24 | DRG: 291 | Disposition: A | Discharge status: Home / Self Care | Payer: Medicare Other | Attending: Internal Medicine | Admitting: Internal Medicine

## 2015-04-18 DIAGNOSIS — Z87891 Personal history of nicotine dependence: Secondary | ICD-10-CM | POA: Diagnosis not present

## 2015-04-18 DIAGNOSIS — I5031 Acute diastolic (congestive) heart failure: Secondary | ICD-10-CM | POA: Diagnosis present

## 2015-04-18 DIAGNOSIS — I4891 Unspecified atrial fibrillation: Secondary | ICD-10-CM | POA: Diagnosis present

## 2015-04-18 DIAGNOSIS — R0600 Dyspnea, unspecified: Secondary | ICD-10-CM

## 2015-04-18 DIAGNOSIS — Z955 Presence of coronary angioplasty implant and graft: Secondary | ICD-10-CM

## 2015-04-18 DIAGNOSIS — E785 Hyperlipidemia, unspecified: Secondary | ICD-10-CM | POA: Diagnosis present

## 2015-04-18 DIAGNOSIS — E871 Hypo-osmolality and hyponatremia: Secondary | ICD-10-CM | POA: Diagnosis present

## 2015-04-18 DIAGNOSIS — Z7902 Long term (current) use of antithrombotics/antiplatelets: Secondary | ICD-10-CM | POA: Diagnosis not present

## 2015-04-18 DIAGNOSIS — Z7951 Long term (current) use of inhaled steroids: Secondary | ICD-10-CM | POA: Diagnosis not present

## 2015-04-18 DIAGNOSIS — Z79899 Other long term (current) drug therapy: Secondary | ICD-10-CM | POA: Diagnosis not present

## 2015-04-18 DIAGNOSIS — J9621 Acute and chronic respiratory failure with hypoxia: Secondary | ICD-10-CM | POA: Diagnosis present

## 2015-04-18 DIAGNOSIS — I2510 Atherosclerotic heart disease of native coronary artery without angina pectoris: Secondary | ICD-10-CM | POA: Diagnosis present

## 2015-04-18 DIAGNOSIS — Z7982 Long term (current) use of aspirin: Secondary | ICD-10-CM | POA: Diagnosis not present

## 2015-04-18 DIAGNOSIS — J441 Chronic obstructive pulmonary disease with (acute) exacerbation: Secondary | ICD-10-CM

## 2015-04-18 DIAGNOSIS — Z9981 Dependence on supplemental oxygen: Secondary | ICD-10-CM

## 2015-04-18 DIAGNOSIS — J449 Chronic obstructive pulmonary disease, unspecified: Secondary | ICD-10-CM | POA: Diagnosis present

## 2015-04-18 LAB — CBC
HCT: 47.1 % (ref 40.0–52.0)
HGB: 15.6 g/dL (ref 13.0–18.0)
MCH: 28.9 pg (ref 26.0–34.0)
MCHC: 33.2 g/dL (ref 32.0–36.0)
MCV: 87 fL (ref 80–100)
Platelet: 228 10*3/uL (ref 150–440)
RBC: 5.41 10*6/uL (ref 4.40–5.90)
RDW: 16.9 % — ABNORMAL HIGH (ref 11.5–14.5)
WBC: 7.6 10*3/uL (ref 3.8–10.6)

## 2015-04-18 LAB — COMPREHENSIVE METABOLIC PANEL
ALT: 23 U/L
AST: 25 U/L
Albumin: 3.9 g/dL
Alkaline Phosphatase: 83 U/L
Anion Gap: 10 (ref 7–16)
BILIRUBIN TOTAL: 1.5 mg/dL — AB
BUN: 17 mg/dL
CHLORIDE: 95 mmol/L — AB
CO2: 24 mmol/L
Calcium, Total: 8.7 mg/dL — ABNORMAL LOW
Creatinine: 0.97 mg/dL
EGFR (African American): >60
GLUCOSE: 126 mg/dL — AB
Potassium: 3.8 mmol/L
SODIUM: 129 mmol/L — AB
Total Protein: 7.5 g/dL

## 2015-04-18 LAB — CK TOTAL AND CKMB (NOT AT ARMC)
CK, Total: 29 U/L — ABNORMAL LOW
CK-MB: 1.6 ng/mL

## 2015-04-18 LAB — PRO B NATRIURETIC PEPTIDE: B-Type Natriuretic Peptide: 248 pg/mL — ABNORMAL HIGH

## 2015-04-18 LAB — TROPONIN I: Troponin-I: <0.03 ng/mL

## 2015-04-19 DIAGNOSIS — J9621 Acute and chronic respiratory failure with hypoxia: Secondary | ICD-10-CM | POA: Diagnosis present

## 2015-04-19 LAB — BASIC METABOLIC PANEL
Anion Gap: 6 — ABNORMAL LOW (ref 7–16)
BUN: 18 mg/dL
Calcium, Total: 8.7 mg/dL — ABNORMAL LOW
Chloride: 99 mmol/L — ABNORMAL LOW
Co2: 28 mmol/L
Creatinine: 0.99 mg/dL
EGFR (Non-African Amer.): >60
Glucose: 147 mg/dL — ABNORMAL HIGH
Potassium: 4.7 mmol/L
Sodium: 133 mmol/L — ABNORMAL LOW

## 2015-04-19 LAB — CBC WITH DIFFERENTIAL/PLATELET
BASOS PCT: 0.1 %
Basophil #: 0 10*3/uL (ref 0.0–0.1)
Eosinophil #: 0 10*3/uL (ref 0.0–0.7)
Eosinophil %: 0 %
HCT: 42.9 % (ref 40.0–52.0)
HGB: 14.7 g/dL (ref 13.0–18.0)
LYMPHS PCT: 17.1 %
Lymphocyte #: 0.7 10*3/uL — ABNORMAL LOW (ref 1.0–3.6)
MCH: 31 pg (ref 26.0–34.0)
MCHC: 34.3 g/dL (ref 32.0–36.0)
MCV: 91 fL (ref 80–100)
MONOS PCT: 1.2 %
Monocyte #: 0 x10 3/mm — ABNORMAL LOW (ref 0.2–1.0)
NEUTROS ABS: 3.3 10*3/uL (ref 1.4–6.5)
Neutrophil %: 81.6 %
Platelet: 213 10*3/uL (ref 150–440)
RBC: 4.74 10*6/uL (ref 4.40–5.90)
RDW: 16.4 % — ABNORMAL HIGH (ref 11.5–14.5)
WBC: 4 10*3/uL (ref 3.8–10.6)

## 2015-04-19 MED ORDER — MOMETASONE FURO-FORMOTEROL FUM 200-5 MCG/ACT IN AERO
2.0000 | INHALATION_SPRAY | Freq: Two times a day (BID) | RESPIRATORY_TRACT | Status: DC
  Administered 2015-04-20 – 2015-04-24 (×9): 2 via RESPIRATORY_TRACT
  Filled 2015-04-19: qty 8.8

## 2015-04-19 MED ORDER — METHYLPREDNISOLONE SODIUM SUCC 40 MG IJ SOLR
40.0000 mg | Freq: Two times a day (BID) | INTRAMUSCULAR | Status: DC
  Administered 2015-04-20: 40 mg via INTRAVENOUS
  Filled 2015-04-19: qty 1

## 2015-04-19 MED ORDER — VANCOMYCIN HCL IN DEXTROSE 1-5 GM/200ML-% IV SOLN
1000.0000 mg | Freq: Two times a day (BID) | INTRAVENOUS | Status: DC
  Administered 2015-04-20 – 2015-04-23 (×7): 1000 mg via INTRAVENOUS
  Filled 2015-04-19 (×10): qty 200

## 2015-04-19 MED ORDER — CLOPIDOGREL BISULFATE 75 MG PO TABS
75.0000 mg | ORAL_TABLET | Freq: Every day | ORAL | Status: DC
  Administered 2015-04-20 – 2015-04-24 (×5): 75 mg via ORAL
  Filled 2015-04-19 (×5): qty 1

## 2015-04-19 MED ORDER — ATORVASTATIN CALCIUM 20 MG PO TABS
40.0000 mg | ORAL_TABLET | Freq: Every day | ORAL | Status: DC
  Administered 2015-04-20 – 2015-04-23 (×5): 40 mg via ORAL
  Filled 2015-04-19 (×6): qty 2

## 2015-04-19 MED ORDER — METHYLPREDNISOLONE SODIUM SUCC 40 MG IJ SOLR: 40.0000 mg | Freq: Four times a day (QID) | INTRAMUSCULAR | Status: DC

## 2015-04-19 MED ORDER — ASPIRIN EC 81 MG PO TBEC
81.0000 mg | DELAYED_RELEASE_TABLET | Freq: Every day | ORAL | Status: DC
  Administered 2015-04-20 – 2015-04-24 (×5): 81 mg via ORAL
  Filled 2015-04-19 (×6): qty 1

## 2015-04-19 MED ORDER — IPRATROPIUM-ALBUTEROL 0.5-2.5 (3) MG/3ML IN SOLN: 3.0000 mL | Freq: Four times a day (QID) | RESPIRATORY_TRACT | Status: DC | PRN

## 2015-04-19 MED ORDER — ENOXAPARIN SODIUM 40 MG/0.4ML ~~LOC~~ SOLN
40.0000 mg | SUBCUTANEOUS | Status: DC
  Administered 2015-04-20 – 2015-04-23 (×4): 40 mg via SUBCUTANEOUS
  Filled 2015-04-19 (×4): qty 0.4

## 2015-04-19 MED ORDER — TIOTROPIUM BROMIDE MONOHYDRATE 18 MCG IN CAPS
18.0000 ug | ORAL_CAPSULE | Freq: Every day | RESPIRATORY_TRACT | Status: DC
  Administered 2015-04-20 – 2015-04-24 (×5): 18 ug via RESPIRATORY_TRACT
  Filled 2015-04-19: qty 5

## 2015-04-19 MED ORDER — FUROSEMIDE 10 MG/ML IJ SOLN
20.0000 mg | Freq: Two times a day (BID) | INTRAMUSCULAR | Status: DC
  Administered 2015-04-20 – 2015-04-22 (×5): 20 mg via INTRAVENOUS
  Filled 2015-04-19 (×5): qty 2

## 2015-04-19 MED ORDER — LEVOFLOXACIN 500 MG PO TABS
500.0000 mg | ORAL_TABLET | Freq: Every day | ORAL | Status: DC
  Administered 2015-04-20 – 2015-04-23 (×4): 500 mg via ORAL
  Filled 2015-04-19 (×4): qty 1

## 2015-04-19 MED ORDER — ACETAMINOPHEN 325 MG PO TABS: 650.0000 mg | ORAL_TABLET | ORAL | Status: DC | PRN

## 2015-04-19 MED ORDER — AMLODIPINE BESYLATE 10 MG PO TABS
10.0000 mg | ORAL_TABLET | Freq: Every day | ORAL | Status: DC
  Administered 2015-04-20 – 2015-04-24 (×5): 10 mg via ORAL
  Filled 2015-04-19 (×5): qty 1

## 2015-04-19 MED ORDER — CARVEDILOL 3.125 MG PO TABS
3.1250 mg | ORAL_TABLET | Freq: Two times a day (BID) | ORAL | Status: DC
  Administered 2015-04-20 – 2015-04-24 (×9): 3.125 mg via ORAL
  Filled 2015-04-19 (×9): qty 1

## 2015-04-19 MED ORDER — ONDANSETRON HCL 4 MG/2ML IJ SOLN: 4.0000 mg | INTRAMUSCULAR | Status: DC | PRN

## 2015-04-19 MED ORDER — IPRATROPIUM-ALBUTEROL 0.5-2.5 (3) MG/3ML IN SOLN: 3.0000 mL | Freq: Four times a day (QID) | RESPIRATORY_TRACT | Status: DC

## 2015-04-19 MED ORDER — AMIODARONE HCL 200 MG PO TABS
200.0000 mg | ORAL_TABLET | Freq: Every day | ORAL | Status: DC
  Administered 2015-04-20 – 2015-04-23 (×4): 200 mg via ORAL
  Filled 2015-04-19 (×5): qty 1

## 2015-04-20 LAB — SODIUM: SODIUM: 133 mmol/L — AB (ref 135–145)

## 2015-04-20 LAB — BILIRUBIN, TOTAL: Total Bilirubin: 1 mg/dL (ref 0.3–1.2)

## 2015-04-20 LAB — CULTURE, BLOOD (SINGLE)

## 2015-04-20 LAB — CREATININE, SERUM: CREATININE: 1.01 mg/dL (ref 0.61–1.24)

## 2015-04-20 MED ORDER — METHYLPREDNISOLONE 4 MG PO TABS
4.0000 mg | ORAL_TABLET | ORAL | Status: AC
  Administered 2015-04-20: 4 mg via ORAL

## 2015-04-20 MED ORDER — METHYLPREDNISOLONE 4 MG PO TABS
4.0000 mg | ORAL_TABLET | Freq: Three times a day (TID) | ORAL | Status: AC
  Administered 2015-04-21 (×3): 4 mg via ORAL
  Filled 2015-04-20 (×3): qty 1

## 2015-04-20 MED ORDER — METHYLPREDNISOLONE 4 MG PO TABS
8.0000 mg | ORAL_TABLET | Freq: Every evening | ORAL | Status: AC
  Administered 2015-04-20: 8 mg via ORAL
  Filled 2015-04-20: qty 2

## 2015-04-20 MED ORDER — METHYLPREDNISOLONE 4 MG PO TABS
8.0000 mg | ORAL_TABLET | Freq: Every day | ORAL | Status: AC
Start: 2015-04-20 — End: 2015-04-21
  Filled 2015-04-20: qty 2

## 2015-04-20 MED ORDER — IPRATROPIUM-ALBUTEROL 0.5-2.5 (3) MG/3ML IN SOLN: 3.0000 mL | Freq: Four times a day (QID) | RESPIRATORY_TRACT | Status: DC | PRN

## 2015-04-20 MED ORDER — METHYLPREDNISOLONE 4 MG PO TABS
8.0000 mg | ORAL_TABLET | Freq: Every evening | ORAL | Status: AC
  Administered 2015-04-21: 8 mg via ORAL
  Filled 2015-04-20: qty 2

## 2015-04-20 MED ORDER — METHYLPREDNISOLONE 4 MG PO TBPK
8.0000 mg | ORAL_TABLET | Freq: Every morning | ORAL | Status: DC
  Filled 2015-04-20: qty 21

## 2015-04-20 MED ORDER — METHYLPREDNISOLONE 4 MG PO TABS
4.0000 mg | ORAL_TABLET | Freq: Four times a day (QID) | ORAL | Status: DC
Start: 2015-04-22 — End: 2015-04-24
  Administered 2015-04-22 – 2015-04-24 (×8): 4 mg via ORAL
  Filled 2015-04-20 (×8): qty 1

## 2015-04-20 MED ORDER — METHYLPREDNISOLONE 4 MG PO TBPK: 8.0000 mg | ORAL_TABLET | Freq: Every morning | ORAL | Status: DC

## 2015-04-20 NOTE — H&P (Signed)
PATIENT NAME:  Brett Burton, Brett Burton MR#:  [id number] DATE OF BIRTH:  1929-11-02  DATE OF ADMISSION:  01/14/2015  PRIMARY CARE PHYSICIAN: Sofie Hartigan, MD  CHIEF COMPLAINT: Shortness of breath, cough with sputum for the past 2 days.   HISTORY OF PRESENT ILLNESS: An 81 year old Caucasian male with a history of COPD, CHF, CAD, chronic respiratory failure on home oxygen 3 liters, presented to the ED with the above chief complaint. The patient is alert, awake, oriented, in no acute distress. The patient said that he started to have cough, sputum, and shortness of breath for the past 2 days. The patient denies any fever or chills, but has some wheezing. No hemoptysis. The patient denies any weight gain. No orthopnea, nocturnal dyspnea, or leg edema.   PAST MEDICAL HISTORY: Chronic respiratory failure, COPD, chronic diastolic CHF, hypertension, paroxysmal atrial fibrillation, CAD, hyperlipidemia, remote history of colon cancer.   SOCIAL HISTORY: Former smoker. No alcohol drinking or illicit drugs.   FAMILY HISTORY: CAD.   PAST SURGICAL HISTORY: None.   ALLERGIES: None.   HOME MEDICATIONS: Spiriva 18 mcg 1 capsule inhaled once a day; pravastatin 40 mg p.o. at bedtime; Pacerone 200 mg p.o. tablets, 1 tablet once a day; Imdur 30 mg p.o. daily; Combivent inhaled 3 times a day p.r.n.; Plavix 75 mg p.o. daily; Coreg 3.125 mg p.o. b.i.d.; aspirin 81 mg p.o. daily; Norvasc 5 mg p.o. daily; Advair 500 mcg/50 mcg inhalation powder 1 puff twice a day.   REVIEW OF SYSTEMS: CONSTITUTIONAL: The patient denies any fever or chills. No headache or dizziness. No weakness.  EYES: No double vision, blurry vision.  EARS, NOSE, AND THROAT: No postnasal drip, slurred speech. or dysphagia.  CARDIOVASCULAR: No chest pain, palpitation, orthopnea, or nocturnal dyspnea. No leg edema.  PULMONARY: Positive for cough, sputum, shortness of breath, and wheezing. No hemoptysis.  GASTROINTESTINAL: No abdominal pain, nausea,  vomiting, diarrhea. No melena or bloody stool.  GENITOURINARY: No dysuria, hematuria, or incontinence.  SKIN: No rash or jaundice.  NEUROLOGY: No syncope, loss of consciousness, or seizure.  ENDOCRINE: No polyuria, polydipsia, heat or cold intolerance.  HEMATOLOGY: No easy bruising or bleeding.   PHYSICAL EXAMINATION:  VITAL SIGNS: Temperature 98.3, blood pressure 132/71, pulse is 68, oxygen saturation 92% on oxygen 5 liters by nasal cannula.  GENERAL: The patient is alert, awake, oriented, in no acute distress.  HEENT: Pupils round, equal and reactive to light and accommodation. Moist oral mucosa. Clear oropharynx.  NECK: Supple. No JVD or carotid bruit. No lymphadenopathy. No thyromegaly.  CARDIOVASCULAR: S1 and S2, regular rate and rhythm. No murmurs, gallops.  PULMONARY: Bilateral air entry. Bilateral expiratory wheezing. No rhonchi. No crackles. No rales. No use of accessory muscle to breathe.  ABDOMEN: Soft. No distention or tenderness. No organomegaly. Bowel sounds present.  EXTREMITIES: No edema, clubbing, or cyanosis. No calf tenderness, bilateral pedal pulses present.  SKIN: No rash or jaundice.  NEUROLOGY: A and O x 3. No focal deficit. Power 5/5. Sensation intact.   LABORATORY DATA: Chest x-ray shows stable severe underlying chronic lung disease, no acute findings. WBC 10.8, hemoglobin 16.0, platelets 192,000. Troponin less than 0.02. Glucose 148, BUN 13, creatinine 1.12. Electrolytes are normal except sodium 135. EKG showed normal sinus rhythm at 73 BPM.   IMPRESSIONS:  1.  Chronic obstructive pulmonary disease exacerbation.  2.  History of coronary artery disease, chronic diastolic congestive heart failure.  3.  Chronic respiratory failure, on home oxygen. 4.  History of proximal atrial fibrillation, now  sinus rhythm.   PLAN OF TREATMENT:  1.  The patient will be admitted to medical floor. We will start IV Solu-Medrol, DuoNebs around the clock, and continue the patient's  home medication of Advair and Spiriva.  2.  For hypertension we will continue the patient's home hypertension medication.  3.  For CAD, we will continue aspirin and Plavix.   CONDITION: I discussed the patient's condition and plan of treatment with the patient and the patient's wife.   CODE STATUS: The patient wants full code.  TIME SPENT: About 56 minutes.    ____________________________ Demetrios Loll, MD qc:TT D: 01/14/2015 14:23:35 ET T: 01/14/2015 14:49:19 ET JOB#: [id number]  cc: Demetrios Loll, MD, <Dictator> Demetrios Loll MD ELECTRONICALLY SIGNED 01/14/2015 15:45

## 2015-04-20 NOTE — Progress Notes (Signed)
Patient Demographics  Brett Burton, is a 81 y.o. male, DOB - 05/29/1929, JXB:[id number]  Admit date - 04/18/2015   Admitting Physician Henreitta Leber, MD  Outpatient Primary MD for the patient is [facility], Chrissie Noa, MD    No chief complaint on file.       Subjective:   Brett Burton today has, No headache, No chest pain, No abdominal pain - No Nausea, No new weakness tingling or numbness, No Cough - SOB. Came in with SOB, hypoxia to 70, now still on HFNC, doing much better with diuresis, no pain in chest.  CTA chest showed CHF, emphysema, is happy, getting better, no swelling.  Objective:   Filed Vitals:   04/20/15 0500 04/20/15 0810 04/20/15 0821 04/20/15 1123  BP:   115/62   Pulse:   60   Temp:   97.9 F (36.6 C)   TempSrc:   Oral   Resp:   18   Height:      Weight: 85.004 kg (187 lb 6.4 oz)     SpO2:  98% 90% 90%    Wt Readings from Last 3 Encounters:  04/20/15 85.004 kg (187 lb 6.4 oz)     Intake/Output Summary (Last 24 hours) at 04/20/15 1325 Last data filed at 04/20/15 1157  Gross per 24 hour  Intake      0 ml  Output   1350 ml  Net  -1350 ml    ROS:  CONSTITUTIONAL: No documented fever. No fatigue and weakness. No weight gain or weight loss.  EYES: No blurry or double vision.  EARS, NOSE, THROAT: No tinnitus. No postnasal drip. No redness of the oropharynx.  RESPIRATORY:  No cough. No wheeze. No hemoptysis. Positive dyspnea.  CARDIOVASCULAR: No chest pain. No orthopnea. No peripheral edema. No dyspnea on exertion. No palpitation or syncope.  GASTROINTESTINAL: No nausea, no vomiting, no diarrhea. No abdominal pain. No melena or hematochezia.  GENITOURINARY: No dysuria or hematuria.  ENDOCRINE: No polyuria, nocturia, heat or cold intolerance.   HEMATOLOGIC:  No anemia, no bruising, no bleeding.  INTEGUMENTARY: No rashes. No lesions.  MUSCULOSKELETAL: No arthritis, no swelling, no gout.  NEUROLOGIC: No numbness or tingling. No ataxia. No seizure-type activity.   PSYCHIATRIC: No anxiety, no insomnia. No ADD.    Physical Exam   Gen - Awake Alert, Oriented X 3 HEENT - AT, [place],PERRL, moist oral mucosa.   Neck - Supple Neck,No JVD, No cervical lymphadenopathy appriciated.  Lung - Symmetrical Chest wall movement, Good air movement bilaterally, CTAB Heart - RRR,No Gallops,Rubs or new Murmurs, No Parasternal Heave Abg - +ve B.Sounds, Abd Soft, No tenderness, No organomegaly appriciated, No rebound - guarding or rigidity. Ext - No Cyanosis, Clubbing or edema, No new Rash or bruise  Neuro - AAO X 3, no focal motor or sensory defecits b/l  Data Review   Micro Results Recent Results (from the past 240 hour(s))  Culture, blood (single)     Status: None (Preliminary result)   Collection Time: 04/18/15  6:27 PM  Result Value Ref Range Status   Micro Text Report   Preliminary       ORGANISM 1                COAGULASE NEGATIVE STAPHYLOCOCCUS   COMMENT                   IN ANAEROBE BOTTLE ONLY   COMMENT  POSSIBLE CONTAMINATION W/SKIN FLORA   GRAM STAIN                GRAM POSITIVE COCCI IN CLUSTERS   ANTIBIOTIC                                                      Culture, blood (single)     Status: None (Preliminary result)   Collection Time: 04/18/15  6:27 PM  Result Value Ref Range Status   Micro Text Report   Preliminary       COMMENT                   NO GROWTH IN 18-24 HOURS   ANTIBIOTIC                                                        Radiology Reports Ct Angio Chest Pe W/cm &/or Wo Cm  04/18/2015   CLINICAL DATA:  Shortness of breath. From pulmonologist office. Decreased oxygen saturation. O2 sat of 68% on 4 L of oxygen. Tachypneic.  EXAM: CT ANGIOGRAPHY CHEST WITH CONTRAST  TECHNIQUE: Multidetector CT imaging of the chest was performed using the standard protocol during bolus administration of intravenous contrast. Multiplanar CT image reconstructions and MIPs were obtained to evaluate the vascular anatomy.   CONTRAST:  75 cc Omnipaque 350  COMPARISON:  Chest x-ray 04/18/2015  FINDINGS: Heart: Significant coronary artery calcification. Heart is normal in size. No pericardial effusion.  Vascular structures: The pulmonary arteries are well opacified. No evidence for acute pulmonary embolus.  Mediastinum/thyroid: The visualized portion of the thyroid gland has a normal appearance. There is enlargement of numerous mediastinal lymph nodes. Precarinal lymph node is 1.5 cm. Right hilar lymph nodes are 1.2 1.5 cm. Subcarinal lymph node is 1.4 cm. Left hilar lymph node is 1.4 cm.  Lungs/Airways: There is significant emphysematous change throughout the lungs. Fibrotic changes are identified at the lung bases. There is superimposed density within the normal lung parenchyma which appears fairly diffuse, favoring edema over infectious process.  Upper abdomen: Stable enlargement of both adrenal glands.  Chest wall/osseous structures: Mid thoracic spondylosis. No suspicious lytic or blastic lesions are identified.  Review of the MIP images confirms the above findings.  IMPRESSION: 1. Significant coronary artery calcification. 2. Technically adequate exam showing no pulmonary embolus. 3. Significant emphysema and underlying airspace filling, favoring edema over infectious process. 4. Enlarged mediastinal and hilar lymph nodes may be reactive given the degree of emphysema and fibrosis. Some degree of adenopathy can also be seen with pulmonary edema. However, consider follow-up, as it is difficult to exclude malignancy. 5. Stable enlargement of the adrenal glands.   Electronically Signed   By: Nolon Nations M.D.   On: 04/18/2015 20:14    CBC  Recent Labs Lab 04/18/15 1542 04/19/15 0551  WBC 7.6 4.0  HGB 15.6 14.7  HCT 47.1 42.9  PLT 228 213  MCV 87 91  MCH 28.9 31.0  MCHC 33.2 34.3  RDW 16.9* 16.4*  LYMPHSABS  --  0.7*  MONOABS  --  0.0*  EOSABS  --  0.0  BASOSABS  --  0.0  Chemistries   Recent Labs Lab  04/18/15 1542 04/19/15 0551 04/20/15 0613  NA  --   --  133*  CO2 24 28  --   BUN 17 18  --   CREATININE 0.97 0.99 1.01  CALCIUM 8.7* 8.7*  --   AST 25  --   --   ALT 23  --   --   ALKPHOS 83  --   --   BILITOT  --   --  1.0   ------------------------------------------------------------------------------------------------------------------ estimated creatinine clearance is 57.2 mL/min (by C-G formula based on Cr of 1.01). ------------------------------------------------------------------------------------------------------------------ No results for input(s): HGBA1C in the last 72 hours. ------------------------------------------------------------------------------------------------------------------ No results for input(s): CHOL, HDL, LDLCALC, TRIG, CHOLHDL, LDLDIRECT in the last 72 hours. ------------------------------------------------------------------------------------------------------------------ No results for input(s): TSH, T4TOTAL, T3FREE, THYROIDAB in the last 72 hours.  Invalid input(s): FREET3 ------------------------------------------------------------------------------------------------------------------ No results for input(s): VITAMINB12, FOLATE, FERRITIN, TIBC, IRON, RETICCTPCT in the last 72 hours.  Coagulation profile No results for input(s): INR, PROTIME in the last 168 hours.  No results for input(s): DDIMER in the last 72 hours.  Cardiac Enzymes No results for input(s): CKMB, TROPONINI, MYOGLOBIN in the last 168 hours.  Invalid input(s): CK ------------------------------------------------------------------------------------------------------------------ Invalid input(s): POCBNP       Assessment & Plan   Active Problems:   Acute on chronic respiratory failure with hypoxemia   1.  acute on chronci respiratory failure with hypoxia, due to acute CHF, contienu lasix, stable Cr, oxygenation is improving, but still remains on HFNC, although clinically  better 2acute diastolic  CHf, lasix, BP control is adequate, folwoing 3. COPD exacerbation, minimal if any signs of exacerbation on today's exam, tapering off steroids, contineu inhalers, levaquin PO 4.hyponatremia, improved with diuresis, remains stable today at 133,  follwoing       Medications  Scheduled Meds: . amiodarone  200 mg Oral Daily  . amLODipine  10 mg Oral Daily  . aspirin EC  81 mg Oral Daily  . atorvastatin  40 mg Oral q1800  . carvedilol  3.125 mg Oral BID WC  . clopidogrel  75 mg Oral Daily  . enoxaparin (LOVENOX) injection  40 mg Subcutaneous Q24H  . furosemide  20 mg Intravenous Q12H  . levofloxacin  500 mg Oral q1800  . methylPREDNISolone (SOLU-MEDROL) injection  40 mg Intravenous Q12H  . mometasone-formoterol  2 puff Inhalation BID  . tiotropium  18 mcg Inhalation Daily  . vancomycin  1,000 mg Intravenous Q12H   Continuous Infusions:  PRN Meds:.acetaminophen, ipratropium-albuterol, ondansetron (ZOFRAN) IV   DVT Prophylaxis  Lovenox   Code Status: full  Family Communication: done with patient's family present in the  room  Disposition Plan: to home vs rehab, to be determined   Time Spent in minutes   40 min   Brett Burton M.D on 04/20/2015 at [facility] PM  [facility]  [phone]

## 2015-04-20 NOTE — Progress Notes (Signed)
Previous documentation in [facility], [id number], Domingo Cocking, RN

## 2015-04-20 NOTE — Discharge Summary (Signed)
PATIENT NAME:  Brett Burton, Brett Burton MR#:  [id number] DATE OF BIRTH:  1929-05-23  DATE OF ADMISSION:  01/14/2015 DATE OF DISCHARGE:  01/16/2015  DISCHARGE DIAGNOSES: 1. Chronic obstructive pulmonary disease exacerbation. 2. Acute-on-chronic respiratory failure due to chronic obstructive pulmonary disease exacerbation.  3. Coronary artery disease with stent.   DISCHARGE MEDICATIONS: Combivent 1 puff p.o. t.i.d., Plavix 75 mg p.o. daily, Coreg 3.125 mg p.o. b.i.d., Pacerone 200 mg p.o. daily, amlodipine 5 mg p.o. daily, Imdur 30 mg p.o. daily, pravastatin 40 mg p.o. daily, aspirin 81 mg p.o. daily, Advair Diskus 500/50 at 1 puff b.i.d., Spiriva 18 mcg inhalation daily, azithromycin 500 daily for 5 days, Augmentin 875/125 at 1 tablet p.o. b.i.d. for 10 days, prednisone 20 mg 3 tablets daily for 2 days, 2 tablets daily for 1 days, 1 tablet daily for 2 days and then stop.   CONSULTATIONS: None.   HOSPITAL COURSE: The patient is an 81 year old male patient with COPD on chronic home oxygen, 3 liters, comes in because of shortness of breath, cough, and sputum for 2 days. The patient had severe wheezing on admission. Oxygen saturation was 92% on 5 liters. He was admitted to hospitalist service for COPD exacerbation with acute-on-chronic respiratory failure due to COPD exacerbation. Chest x-ray did not show any pneumonia. EKG showed normal sinus rhythm at 73 beats per minute. He received IV Solu-Medrol, continued on oxygen. We gave him nebulizers around the clock and continued his home medicines, Spiriva and Advair. The patient's symptoms improved. His wheezing improved, cough improved, and the patient received empiric antibiotics for bronchitis here. The patient received azithromycin in the hospital. He nicely improved in terms of his breathing.  (Dictation Anomaly) his oxygenation was 92% on 3 liters. He has 3 liters oxygen at home. The patient also was seen by physical therapy. They recommended home physical therapy,  so we arranged home health physical therapy for him.   PHYSICAL EXAMINATION: DISCHARGE VITAL SIGNS: Temperature 97.6, heart rate 68, blood pressure at 136/66, saturation 92% on 3 liters.  CARDIOVASCULAR: S1, S2 regular.  LUNGS: Mild expiratory wheezing, but much better than before.  ABDOMEN: Soft, nontender, nondistended. Bowel sounds present.   The patient's primary doctor is Dr. Ellison Hughs and Dr. Devona Konig is the consulting pulmonologist here. The patient's CBC and Chem-7 stayed within normal limits. The patient's chest x-ray showed chronic lung disease without acute findings. He has a history of diastolic heart failure and also paroxysmal atrial fibrillation and history of CAD. We continued his heart medications; namely, Pacerone, Imdur, aspirin, pravastatin. He remained asymptomatic and we discharged him home.   TIME SPENT: More than 30 minutes.     ____________________________ Epifanio Lesches, MD sk:lm D: 01/18/2015 10:11:56 ET T: 01/18/2015 21:02:13 ET JOB#: [id number]  cc: Epifanio Lesches, MD, <Dictator> Sofie Hartigan, MD Allyne Gee, MD Epifanio Lesches MD ELECTRONICALLY SIGNED 01/21/2015 13:31

## 2015-04-21 ENCOUNTER — Inpatient Hospital Stay: Payer: Medicare Other

## 2015-04-21 LAB — BASIC METABOLIC PANEL
Anion gap: 6 (ref 5–15)
BUN: 27 mg/dL — ABNORMAL HIGH (ref 6–20)
CALCIUM: 8.1 mg/dL — AB (ref 8.9–10.3)
CHLORIDE: 95 mmol/L — AB (ref 101–111)
CO2: 30 mmol/L (ref 22–32)
Creatinine, Ser: 1.01 mg/dL (ref 0.61–1.24)
GFR calc non Af Amer: >60 mL/min (ref >60)
Glucose, Bld: 178 mg/dL — ABNORMAL HIGH (ref 65–99)
Potassium: 4.1 mmol/L (ref 3.5–5.1)
SODIUM: 131 mmol/L — AB (ref 135–145)

## 2015-04-21 LAB — VANCOMYCIN, TROUGH: Vancomycin Tr: 38 ug/mL (ref 10–20)

## 2015-04-21 NOTE — Progress Notes (Signed)
Patient ID: Brett Burton, male   DOB: 1929/11/02, 81 y.o.   MRN: [id number] Patient Demographics  Brett Burton, is a 81 y.o. male, DOB - January 04, 1929, FKC:[id number]  Admit date - 04/18/2015   Admitting Physician Henreitta Leber, MD  Outpatient Primary MD for the patient is  [facility], Chrissie Noa, MD    No chief complaint on file.       Subjective:   Brett Burton today has, No headache, No chest pain, No abdominal pain - No Nausea, No new weakness tingling or numbness, No Cough - SOB.  Came in with SOB, hypoxia to 70, now still on HFNC, doing well  with diuresis, no pain in chest. CTA chest showed CHF, emphysema,  no leg swelling Objective:   Filed Vitals:   04/21/15 0815 04/21/15 1120 04/21/15 1125 04/21/15 1132  BP: 118/57     Pulse: 59     Temp: 98.5 F (36.9 C)     TempSrc: Oral     Resp: 20     Height:      Weight:      SpO2: 94% 93% 85% 91%    Wt Readings from Last 3 Encounters:  04/21/15 82.192 kg (181 lb 3.2 oz)     Intake/Output Summary (Last 24 hours) at 04/21/15 1220 Last data filed at 04/21/15 0900  Gross per 24 hour  Intake    175 ml  Output   1075 ml  Net   -900 ml    ROS:  CONSTITUTIONAL: No documented fever. No fatigue and weakness. No weight gain or weight loss.  EYES: No blurry or double vision.  EARS, NOSE, THROAT: No tinnitus. No postnasal drip. No redness of the oropharynx.  RESPIRATORY:  No cough. No wheeze. No hemoptysis. Positive dyspnea.  CARDIOVASCULAR: No chest pain. No orthopnea. No peripheral edema. No dyspnea on exertion. No palpitation or syncope.  GASTROINTESTINAL: No nausea, no vomiting, no diarrhea. No abdominal pain. No melena or hematochezia.  GENITOURINARY: No dysuria or hematuria.  ENDOCRINE: No polyuria, nocturia, heat or cold intolerance.   HEMATOLOGIC:  No anemia, no bruising, no bleeding.  INTEGUMENTARY: No rashes. No lesions.  MUSCULOSKELETAL: No arthritis, no swelling, no gout.  NEUROLOGIC: No numbness or tingling. No ataxia.  No seizure-type activity.  PSYCHIATRIC: No anxiety, no insomnia. No ADD.    Physical Exam   Gen - Awake Alert, Oriented X 3 HEENT - AT, [place],PERRL, moist oral mucosa.   Neck - Supple Neck,No JVD, No cervical lymphadenopathy appriciated.  Lung - Symmetrical Chest wall movement, Good air movement bilaterally, few rhonchi, crackles b/l Heart - RRR,No Gallops,Rubs or new Murmurs, No Parasternal Heave Abg - +ve B.Sounds, Abd Soft, No tenderness, No organomegaly appriciated, No rebound - guarding or rigidity. Ext - No Cyanosis, Clubbing or edema, No new Rash or bruise  Neuro - AAO X 3, no focal motor or sensory defecits b/l  Data Review   Micro Results Recent Results (from the past 240 hour(s))  Culture, blood (single)     Status: None (Preliminary result)   Collection Time: 04/18/15  6:27 PM  Result Value Ref Range Status   Micro Text Report   Preliminary       ORGANISM 1                COAGULASE NEGATIVE STAPHYLOCOCCUS   COMMENT                   IN ANAEROBE BOTTLE ONLY   COMMENT  POSSIBLE CONTAMINATION W/SKIN FLORA   GRAM STAIN                GRAM POSITIVE COCCI IN CLUSTERS   ANTIBIOTIC                                                      Culture, blood (single)     Status: None (Preliminary result)   Collection Time: 04/18/15  6:27 PM  Result Value Ref Range Status   Micro Text Report   Preliminary       COMMENT                   NO GROWTH IN 18-24 HOURS   ANTIBIOTIC                                                        Radiology Reports Dg Chest 2 View  04/21/2015   CLINICAL DATA:  Hypoxia, emphysema, dyspnea  EXAM: CHEST  2 VIEW  COMPARISON:  04/18/2015  FINDINGS: Cardiomediastinal silhouette is stable. Again noted reticular interstitial prominence especially in lower lobes. Although findings may be due chronic interstitial lung disease superimposed mild pneumonitis cannot be excluded. No convincing pulmonary edema. No focal infiltrate. Hyperinflation.  Thoracic spine osteopenia.  IMPRESSION: Again noted reticular interstitial prominence especially in lower lobes. Although findings may be due chronic interstitial lung disease superimposed mild pneumonitis cannot be excluded. No convincing pulmonary edema. No focal infiltrate. Hyperinflation.   Electronically Signed   By: Lahoma Crocker M.D.   On: 04/21/2015 09:20    CBC  Recent Labs Lab 04/18/15 1542 04/19/15 0551  WBC 7.6 4.0  HGB 15.6 14.7  HCT 47.1 42.9  PLT 228 213  MCV 87 91  MCH 28.9 31.0  MCHC 33.2 34.3  RDW 16.9* 16.4*  LYMPHSABS  --  0.7*  MONOABS  --  0.0*  EOSABS  --  0.0  BASOSABS  --  0.0    Chemistries   Recent Labs Lab 04/18/15 1542 04/19/15 0551 04/20/15 0613 04/21/15 0229  NA 129* 133* 133* 131*  K 3.8 4.7  --  4.1  CL 95* 99*  --  95*  CO2 24 28  --  30  GLUCOSE 126* 147*  --  178*  BUN 17 18  --  27*  CREATININE 0.97 0.99 1.01 1.01  CALCIUM 8.7* 8.7*  --  8.1*  AST 25  --   --   --   ALT 23  --   --   --   ALKPHOS 83  --   --   --   BILITOT  --   --  1.0  --    ------------------------------------------------------------------------------------------------------------------ estimated creatinine clearance is 57.2 mL/min (by C-G formula based on Cr of 1.01). ------------------------------------------------------------------------------------------------------------------ No results for input(s): HGBA1C in the last 72 hours. ------------------------------------------------------------------------------------------------------------------ No results for input(s): CHOL, HDL, LDLCALC, TRIG, CHOLHDL, LDLDIRECT in the last 72 hours. ------------------------------------------------------------------------------------------------------------------ No results for input(s): TSH, T4TOTAL, T3FREE, THYROIDAB in the last 72 hours.  Invalid input(s):  FREET3 ------------------------------------------------------------------------------------------------------------------ No results for input(s): VITAMINB12, FOLATE, FERRITIN, TIBC, IRON, RETICCTPCT in the last 72 hours.  Coagulation profile No results  for input(s): INR, PROTIME in the last 168 hours.  No results for input(s): DDIMER in the last 72 hours.  Cardiac Enzymes No results for input(s): CKMB, TROPONINI, MYOGLOBIN in the last 168 hours.  Invalid input(s): CK ------------------------------------------------------------------------------------------------------------------ Invalid input(s): POCBNP       Assessment & Plan   Active Problems:   Acute on chronic respiratory failure with hypoxemia     1. acute on chronci respiratory failure with hypoxia, due to acute CHF, contienu lasix, about 2.2 liters negative since admission, stable Cr, oxygenation has improved ,  but still remains on HFNC, needs to be weaned off to [place], but O2 sats 85 % on 6 liters O2 per [place], clinically better 2  acute diastolic CHf, lasix, BP control is adequate, folwoing 3. COPD exacerbation, some wheezing anteriorly today, continue taper medrol dose pack , contineu inhalers, levaquin PO 4.hyponatremia, improved with diuresis initially, now lower than y-day at 131,  following, SAIDH?, try fluid restriction      Medications  Scheduled Meds: . amiodarone  200 mg Oral Daily  . amLODipine  10 mg Oral Daily  . aspirin EC  81 mg Oral Daily  . atorvastatin  40 mg Oral q1800  . carvedilol  3.125 mg Oral BID WC  . clopidogrel  75 mg Oral Daily  . enoxaparin (LOVENOX) injection  40 mg Subcutaneous Q24H  . furosemide  20 mg Intravenous Q12H  . levofloxacin  500 mg Oral q1800  . methylPREDNISolone  4 mg Oral 3 x daily with food  . [START ON 04/22/2015] methylPREDNISolone  4 mg Oral 4X daily taper  . methylPREDNISolone  8 mg Oral Nightly  . mometasone-formoterol  2 puff Inhalation BID  . tiotropium   18 mcg Inhalation Daily  . vancomycin  1,000 mg Intravenous Q12H   Continuous Infusions:  PRN Meds:.acetaminophen, ipratropium-albuterol, ondansetron (ZOFRAN) IV   DVT Prophylaxis  Lovenox    Code Status: full  Family Communication: d/w wife today face to face  Disposition Plan: home   Time Spent in minutes   43 min   Brett Burton M.D on 04/21/2015 at 12:20 PM  [facility]  [phone]

## 2015-04-21 NOTE — H&P (Signed)
PATIENT NAME:  Brett Burton, Brett Burton MR#:  [id number] DATE OF BIRTH:  1929-07-01  DATE OF ADMISSION:  04/18/2015  PRIMARY CARE PHYSICIAN: Clayborn Bigness, MD.    CHIEF COMPLAINT: Hypoxia and shortness of breath.   HISTORY OF PRESENT ILLNESS: This is an 81 year old male who presented to the Emergency Room from Dr. Humphrey Rolls cardiologist's office due to O2 saturations in the 70s. The patient says that he has been having worsening exertional dyspnea now for over a week. He went to see his pulmonologist who then referred him to his cardiologist. He was having an echocardiogram done at the cardiologist's office and his O2 saturations were noted to be in the 70s. He was therefore sent to the ER for further evaluation. At triage his O2 saturations was also noted to be in the 70s, but that was on his 4 liters oxygen which he is chronically at home. In the Emergency Room the patient was also placed on 4-5 liters but his O2 saturations remained in the mid to high 70s. Hospitalist services were contacted for further treatment and evaluation.   REVIEW OF SYSTEMS:    CONSTITUTIONAL: No documented fever. No weight gain, no weight loss.  EYES: No blurry or double vision.  EARS, NOSE, AND THROAT: No tinnitus. No postnasal drip. No redness of the oropharynx.  RESPIRATORY: No cough, no wheeze, no hemoptysis. Positive dyspnea on exertion.  CARDIOVASCULAR: No chest pain, no orthopnea, no palpitations, no syncope.  GASTROINTESTINAL: No nausea, no vomiting, no diarrhea, no abdominal pain. No melena or hematochezia.  GENITOURINARY: No dysuria or hematuria.  ENDOCRINE: No polyuria, nocturia, heat or cold intolerance.  HEMATOLOGIC: No anemia. No bruising. No bleeding.  INTEGUMENTARY: No rashes. No lesions.  MUSCULOSKELETAL: No arthritis, no swelling, no gout.  NEUROLOGIC: No numbness or tingling. No ataxia. No seizure-type activity.  PSYCHIATRIC: No anxiety, no insomnia, no ADD.   PAST MEDICAL HISTORY: Consistent with COPD on chronic  home O2, history of coronary artery disease status post stent placement, hyperlipidemia.   ALLERGIES: No known drug allergies.   SOCIAL HISTORY: Used to be a smoker, quit about 20 + years ago, does have a 40 pack-year smoking history. No alcohol abuse. No illicit drug abuse. Lives at home with his wife.   FAMILY HISTORY: Mother and father are both deceased. Father died from a stroke, mother was diabetic, died from heart disease.   CURRENT MEDICATIONS: Advair 500/50 one puff b.i.d., amiodarone 200 mg daily, amlodipine 10 mg daily, aspirin 81 mg daily, atorvastatin 40 mg daily, Coreg 3.125 mg b.i.d., Plavix 75 mg daily, Combivent Respimat 1 puff daily as needed, Spiriva 1 puff daily.   PHYSICAL EXAMINATION:  VITAL SIGNS: Noted to be temperature is 98.2, pulse 78, respirations 18, blood pressure 149/79, saturations 87% on a 55% Ventimask.    GENERAL: The patient is a pleasant-appearing male, but in mild respiratory distress.  HEAD, EYES, EARS, NOSE, AND THROAT: Atraumatic, normocephalic. Extraocular muscles are intact. Pupils equal and reactive to light. Sclerae anicteric. No conjunctival injection. No pharyngeal erythema.  NECK: Supple. There is no jugular venous distention. No bruits, no lymphadenopathy, or thyromegaly.  HEART: Regular rate and rhythm. No murmurs. No rubs. No clicks.  LUNGS: He has good air entry bilaterally. Negative use of accessory muscles. No dullness to percussion.  ABDOMEN: Soft, flat, nontender, nondistended. Has good bowel sounds. No hepatosplenomegaly appreciated.  EXTREMITIES: No evidence of any cyanosis, clubbing, or peripheral edema. He has + 2 pedal and radial pulses bilaterally.  NEUROLOGIC: The patient is  alert, awake, and oriented x 3, with no focal motor or sensory deficits appreciated bilaterally.  SKIN: Moist and warm with no rashes appreciated.  LYMPHATIC: There is no cervical or axillary lymphadenopathy.     LABORATORY DATA: Serum glucose of 126, BUN 17,  creatinine 0.9, sodium 129, potassium 3.8, chloride 95, bicarbonate 24. The patient's LFTs are within normal limits. Troponin less than 0.03. White cell count 7.6, hemoglobin 15.6, hematocrit 47.1, platelet count 228,000. The patient did have an ABG done which showed a pH of 7.343, pCO2 of 37, pO2 of 47, saturations 89% on a 55% Ventimask. The patient did have a chest x-ray done which showed COPD with prominent interstitial opacities slightly improved from 03/22/2015, but worse from January 26. This could represent superimposed edema or infection.   ASSESSMENT AND PLAN: This is an 81 year old male with history of chronic obstructive pulmonary disease on chronic home O2, history of coronary artery disease status post stent placement, hyperlipidemia, who presented to the hospital due to shortness of breath, hypoxia from his cardiologist's office with O2 saturations in the 70s.   1.  Acute on chronic respiratory failure with hypoxia. The exact etiology of this is unclear, whether this chronic obstructive pulmonary disease exacerbation or possible underlying pulmonary embolism given his high AA gradient. I will get a stat CT chest with contrast to rule out pulmonary embolism. We will go ahead and treat his underlying COPD with IV steroids, around-the-clock nebulizer treatments, continue his Dulera and Spiriva, and follow him clinically.  2.  Chronic obstructive pulmonary disease exacerbation. As mentioned we will give him IV steroids, around-the-clock nebulizer treatments, continue his Dulera and Spiriva. We will give him empiric antibiotics for possible pneumonia given the chest x-ray findings. I will give him IV  Levaquin. 3.  History of coronary artery disease status post stent placement. The patient has no acute chest pain. I will continue his aspirin, Plavix, statin, and beta blocker.  4.  Hyperlipidemia. Continue atorvastatin.  5.  History of chronic atrial fibrillation. The patient is rate controlled and  is in normal sinus rhythm. Continue his amiodarone and Coreg.   CODE STATUS: The patient is a full code.   TIME SPENT ON ADMISSION: 45 minutes.     ____________________________ Belia Heman. Verdell Carmine, MD vjs:bu D: 04/18/2015 18:36:55 ET T: 04/18/2015 19:11:21 ET JOB#: [id number]  cc: Belia Heman. Verdell Carmine, MD, <Dictator> Henreitta Leber MD ELECTRONICALLY SIGNED 04/18/2015 22:21

## 2015-04-22 LAB — BASIC METABOLIC PANEL
ANION GAP: 4 — AB (ref 5–15)
BUN: 26 mg/dL — ABNORMAL HIGH (ref 6–20)
CALCIUM: 7.9 mg/dL — AB (ref 8.9–10.3)
CO2: 32 mmol/L (ref 22–32)
CREATININE: 0.97 mg/dL (ref 0.61–1.24)
Chloride: 97 mmol/L — ABNORMAL LOW (ref 101–111)
GFR calc Af Amer: >60 mL/min (ref >60)
GFR calc non Af Amer: >60 mL/min (ref >60)
Glucose, Bld: 158 mg/dL — ABNORMAL HIGH (ref 65–99)
POTASSIUM: 4.4 mmol/L (ref 3.5–5.1)
Sodium: 133 mmol/L — ABNORMAL LOW (ref 135–145)

## 2015-04-22 LAB — VANCOMYCIN, TROUGH: VANCOMYCIN TR: 13 ug/mL (ref 10.0–20.0)

## 2015-04-22 MED ORDER — CALCIUM CARBONATE ANTACID 500 MG PO CHEW
2.0000 | CHEWABLE_TABLET | Freq: Four times a day (QID) | ORAL | Status: DC | PRN
  Filled 2015-04-22: qty 2

## 2015-04-22 MED ORDER — FUROSEMIDE 10 MG/ML IJ SOLN
20.0000 mg | Freq: Every day | INTRAMUSCULAR | Status: DC
  Administered 2015-04-23: 20 mg via INTRAVENOUS
  Filled 2015-04-22: qty 2

## 2015-04-22 NOTE — Care Management Note (Signed)
Case Management Note  Patient Details  Name: Brett Burton MRN: [id number] Date of Birth: 06-11-1929  Subjective/Objective:   Admitted with SOB, hypoxia in 70's, now off high flow nasal canula. CTA chest showed CHF, emphysema. Hx: COPD. On 4L chronically through [facility].  Met with pt and wife. They live at home and are mosty independent with adl's . No DME except PRN use of a wheelchair.  Denies issues obtaining medications, co pays or transportation. Current with PCP at [facility], Brett Burton.  Pt would benefit from home health nursing at discharge. No agency preference. Referral initiated with [facility]. CM to follow progress.               Action/Plan:  Home with Home Health   Expected Discharge Date:                  Expected Discharge Plan:  [place]  In-House Referral:     Discharge planning Services  CM Consult  Post Acute Care Choice:    Choice offered to:  Patient, Spouse  DME Arranged:    DME Agency:     HH Arranged:  RN, Disease Management [place] Agency:  [facility]  Status of Service:  In process, will continue to follow  Medicare Important Message Given:    Date Medicare IM Given:    Medicare IM give by:    Date Additional Medicare IM Given:    Additional Medicare Important Message give by:     If discussed at [place] of Stay Meetings, dates discussed:    Additional Comments:  Brett Mango, RN 04/22/2015, 2:39 PM

## 2015-04-22 NOTE — Consult Note (Signed)
ANTIBIOTIC CONSULT NOTE - FOLLOW UP  Pharmacy Consult for Vancomycin  Indication: pneumonia  No Known Allergies  Patient Measurements: Height: 5\' 10"  (177.8 cm) Weight: 187 lb 8 oz (85.049 kg) IBW/kg (Calculated) : 73 Adjusted Body Weight: 77.8  Vital Signs: Temp: 98.8 F (37.1 C) (05/03 0014) Temp Source: Oral (05/03 0014) BP: 108/54 mmHg (05/03 0014) Pulse Rate: 60 (05/03 0014) Intake/Output from previous day: 05/02 0701 - 05/03 0700 In: 560 [P.O.:360; IV Piggyback:200] Out: 2450 [Urine:2450] Intake/Output from this shift: Total I/O In: -  Out: 400 [Urine:400]  Labs:  Recent Labs  04/20/15 0613 04/21/15 0229 04/22/15 0543  CREATININE 1.01 1.01 0.97   Estimated Creatinine Clearance: 59.6 mL/min (by C-G formula based on Cr of 0.97).  Recent Labs  04/21/15 0227  [facility] 10*     Microbiology: Recent Results (from the past 720 hour(s))  Culture, blood (single)     Status: None (Preliminary result)   Collection Time: 04/18/15  6:27 PM  Result Value Ref Range Status   Micro Text Report   Preliminary       ORGANISM 1                COAGULASE NEGATIVE STAPHYLOCOCCUS   COMMENT                   IN ANAEROBE BOTTLE ONLY   COMMENT                   POSSIBLE CONTAMINATION W/SKIN FLORA   GRAM STAIN                GRAM POSITIVE COCCI IN CLUSTERS   ANTIBIOTIC                                                      Culture, blood (single)     Status: None (Preliminary result)   Collection Time: 04/18/15  6:27 PM  Result Value Ref Range Status   Micro Text Report   Preliminary       COMMENT                   NO GROWTH IN 18-24 HOURS   ANTIBIOTIC                                                        Anti-infectives    Start     Dose/Rate Route Frequency Ordered Stop   04/20/15 1800  levofloxacin (LEVAQUIN) tablet 500 mg     500 mg Oral Daily-1800 04/19/15 1205     04/20/15 0200  vancomycin (VANCOCIN) IVPB 1000 mg/200 mL premix     1,000 mg 200 mL/hr over  60 Minutes Intravenous Every 12 hours 04/19/15 2114        Assessment: Patient being treated for possible pneumonia. Vancomycin 1 g IV q24 hours ordered at the moment. Patient was supposed to have Vancomycin level drawn @ 13:30 on 5/2; however lab is without reagant and being sent out to [facility] awaiting results at this time.   Goal of Therapy:  Vancomycin trough goal 15-20 mcg/ml.   Plan:  Follow up culture results  Nicolas Banh D  04/22/2015,8:13 AM  Level did not return prior to patient being discharged.

## 2015-04-22 NOTE — Progress Notes (Signed)
Patient ID: Brett Burton, male   DOB: [birth year], 81 y.o.   MRN: [id number] [place] [facility] [place] NAME: Stanislaus Kaltenbach    MR#:  [id number]  DATE OF BIRTH:  1929/01/05  SUBJECTIVE:  CHIEF COMPLAINT:  No chief complaint on file. Came in with SOB, hypoxia to 70, now still on HFNC, doing better with diuresis, no pain in chest. CTA chest showed CHF, emphysema, no leg swelling. Remains stable, but weaning from HFNC was not possible as pt became hypoxic on 6 liters O2 per [place] . Feels satisfactory today, no change from yday  REVIEW OF SYSTEMS:   CONSTITUTIONAL: No fever, fatigue or weakness.  EYES: No blurred or double vision.  EARS, NOSE, AND THROAT: No tinnitus or ear pain.  RESPIRATORY: No cough, shortness of breath, wheezing or hemoptysis.  CARDIOVASCULAR: No chest pain, orthopnea, edema.  GASTROINTESTINAL: No nausea, vomiting, diarrhea or abdominal pain.  GENITOURINARY: No dysuria, hematuria.  ENDOCRINE: No polyuria, nocturia,  HEMATOLOGY: No anemia, easy bruising or bleeding SKIN: No rash or lesion. MUSCULOSKELETAL: No joint pain or arthritis.   NEUROLOGIC: No tingling, numbness, weakness.  PSYCHIATRY: No anxiety or depression.   VITAL SIGNS: Blood pressure 120/58, pulse 60, temperature 98.6 F (37 C), temperature source Oral, resp. rate 18, height 5\' 10"  (1.778 m), weight 85.049 kg (187 lb 8 oz), SpO2 100 %.  PHYSICAL EXAMINATION:   GENERAL:  81 y.o.-year-old patient lying in the bed with no acute distress.  EYES: Pupils equal, round, reactive to light and accommodation. No scleral icterus. Extraocular muscles intact.  HEENT: Head atraumatic, normocephalic. Oropharynx and nasopharynx clear.  NECK:  Supple, no jugular venous distention. No thyroid enlargement, no tenderness.  LUNGS: Normal breath sounds bilaterally, no wheezing, few rales, rhonchi, no crepitation. No use of accessory muscles of respiration. Relatively comfortable.   CARDIOVASCULAR: S1, S2 normal. No murmurs, rubs, or gallops.  ABDOMEN: Soft, nontender, nondistended. Bowel sounds present. No organomegaly or mass.  EXTREMITIES: No pedal edema, cyanosis, or clubbing.  NEUROLOGIC: Cranial nerves II through XII are intact. Muscle strength 5/5 in all extremities. Sensation intact. Gait not checked.  PSYCHIATRIC: The patient is alert and oriented x 3.  SKIN: No obvious rash, lesion, or ulcer.   ORDERS/RESULTS REVIEWED:   CBC  Recent Labs Lab 04/18/15 1542 04/19/15 0551  WBC 7.6 4.0  HGB 15.6 14.7  HCT 47.1 42.9  PLT 228 213  MCV 87 91  MCH 28.9 31.0  MCHC 33.2 34.3  RDW 16.9* 16.4*  LYMPHSABS  --  0.7*  MONOABS  --  0.0*  EOSABS  --  0.0  BASOSABS  --  0.0   ------------------------------------------------------------------------------------------------------------------  Chemistries   Recent Labs Lab 04/18/15 1542 04/19/15 0551 04/20/15 0613 04/21/15 0229 04/22/15 0543  NA 129* 133* 133* 131* 133*  K 3.8 4.7  --  4.1 4.4  CL 95* 99*  --  95* 97*  CO2 24 28  --  30 32  GLUCOSE 126* 147*  --  178* 158*  BUN 17 18  --  27* 26*  CREATININE 0.97 0.99 1.01 1.01 0.97  CALCIUM 8.7* 8.7*  --  8.1* 7.9*  AST 25  --   --   --   --   ALT 23  --   --   --   --   ALKPHOS 83  --   --   --   --   BILITOT  --   --  1.0  --   --    ------------------------------------------------------------------------------------------------------------------  estimated creatinine clearance is 59.6 mL/min (by C-G formula based on Cr of 0.97). ------------------------------------------------------------------------------------------------------------------ No results for input(s): TSH, T4TOTAL, T3FREE, THYROIDAB in the last 72 hours.  Invalid input(s): FREET3  Cardiac Enzymes No results for input(s): CKMB, TROPONINI, MYOGLOBIN in the last 168 hours.  Invalid input(s):  CK ------------------------------------------------------------------------------------------------------------------ Invalid input(s): POCBNP ---------------------------------------------------------------------------------------------------------------  RADIOLOGY: Dg Chest 2 View  04/21/2015   CLINICAL DATA:  Hypoxia, emphysema, dyspnea  EXAM: CHEST  2 VIEW  COMPARISON:  04/18/2015  FINDINGS: Cardiomediastinal silhouette is stable. Again noted reticular interstitial prominence especially in lower lobes. Although findings may be due chronic interstitial lung disease superimposed mild pneumonitis cannot be excluded. No convincing pulmonary edema. No focal infiltrate. Hyperinflation. Thoracic spine osteopenia.  IMPRESSION: Again noted reticular interstitial prominence especially in lower lobes. Although findings may be due chronic interstitial lung disease superimposed mild pneumonitis cannot be excluded. No convincing pulmonary edema. No focal infiltrate. Hyperinflation.   Electronically Signed   By: Lahoma Crocker M.D.   On: 04/21/2015 09:20    EKG: No orders found for this or any previous visit.  ASSESSMENT AND PLAN:  1. acute on chronci respiratory failure with hypoxia, due to acute CHF, ? COPD exacerbation, contienu lasix, about 4.6  liters negative since admission, stable Cr, oxygenation has improved , but still remains on HFNC, unable to wean off to [place], as O2 sats 85 % on 6 liters O2 per [place], get DR Humphrey Rolls, pulmonary involved in recommendations, overall clinically better.  2 acute diastolic CHf, decrease lasix, BP control is adequate, following clinically.  3. COPD exacerbation, had wheezing anteriorly y-day, subsided now, continue tapering medrol dose pack , contineu inhalers, levaquin PO, get DR Humphrey Rolls to see pt as well.  4.hyponatremia, improved with diuresis initially, still low at 133 today, likely [place]?, continue fluid restriction  DRUG ALLERGIES: No Known Allergies  CODE STATUS:      Code Status Orders        Start     Ordered   04/19/15 1313  Full code   Continuous     04/19/15 1318      TOTAL TIME TAKING CARE OF THIS PATIENT: 40  minutes.    Theodoro Grist M.D on 04/22/2015 at 11:29 AM  Between 7am to 6pm - Pager - [phone]  After 6pm go to [email or web address] - password [email or web address] [facility]  [place] [facility]  Office  [phone]  CC: Primary care physician; [facility], Chrissie Noa, MD

## 2015-04-23 LAB — CULTURE, BLOOD (SINGLE)

## 2015-04-23 MED ORDER — VANCOMYCIN HCL 10 G IV SOLR
1250.0000 mg | Freq: Two times a day (BID) | INTRAVENOUS | Status: DC
  Filled 2015-04-23 (×3): qty 1250

## 2015-04-23 NOTE — Progress Notes (Signed)
Patient ID: Brett Burton, male   DOB: Apr 27, 1929, 81 y.o.   MRN: [id number] [name] NAME: Brett Burton    MR#:  [id number]  DATE OF BIRTH:  09-May-1929  SUBJECTIVE:  CHIEF COMPLAINT:   Came in with SOB, hypoxia to 70, off HFNC, on 6 liters O2 per [name], but desaturates to 70 on exertion, diuresed about 6.4 liters so far, no pain in chest. CTA chest showed CHF, emphysema, no leg swelling.Feels satisfactory today, no change from y- day, but sob on exertion, feels good sitting up in chair REVIEW OF SYSTEMS:   CONSTITUTIONAL: No fever, fatigue or weakness.  EYES: No blurred or double vision.  EARS, NOSE, AND THROAT: No tinnitus or ear pain.  RESPIRATORY: No cough, shortness of breath, wheezing or hemoptysis.  CARDIOVASCULAR: No chest pain, orthopnea, edema.  GASTROINTESTINAL: No nausea, vomiting, diarrhea or abdominal pain.  GENITOURINARY: No dysuria, hematuria.  ENDOCRINE: No polyuria, nocturia,  HEMATOLOGY: No anemia, easy bruising or bleeding SKIN: No rash or lesion. MUSCULOSKELETAL: No joint pain or arthritis.   NEUROLOGIC: No tingling, numbness, weakness.  PSYCHIATRY: No anxiety or depression.   VITAL SIGNS: Blood pressure 112/55, pulse 57, temperature 97.9 F (36.6 C), temperature source Oral, resp. rate 20, height 5\' 10"  (1.778 m), weight 79.379 kg (175 lb), SpO2 92 %.  PHYSICAL EXAMINATION:   GENERAL:  81 y.o.-year-old patient lying in the bed with no acute distress.  EYES: Pupils equal, round, reactive to light and accommodation. No scleral icterus. Extraocular muscles intact.  HEENT: Head atraumatic, normocephalic. Oropharynx and nasopharynx clear.  NECK:  Supple, no jugular venous distention. No thyroid enlargement, no tenderness.  LUNGS: Normal breath sounds bilaterally, no wheezing, few rales, rhonchi, no crepitation. No use of accessory muscles of respiration. Relatively comfortable.  CARDIOVASCULAR: S1, S2  normal. No murmurs, rubs, or gallops.  ABDOMEN: Soft, nontender, nondistended. Bowel sounds present. No organomegaly or mass.  EXTREMITIES: No pedal edema, cyanosis, or clubbing.  NEUROLOGIC: Cranial nerves II through XII are intact. Muscle strength 5/5 in all extremities. Sensation intact. Gait not checked.  PSYCHIATRIC: The patient is alert and oriented x 3.  SKIN: No obvious rash, lesion, or ulcer.   ORDERS/RESULTS REVIEWED:   CBC  Recent Labs Lab 04/18/15 1542 04/19/15 0551  WBC 7.6 4.0  HGB 15.6 14.7  HCT 47.1 42.9  PLT 228 213  MCV 87 91  MCH 28.9 31.0  MCHC 33.2 34.3  RDW 16.9* 16.4*  LYMPHSABS  --  0.7*  MONOABS  --  0.0*  EOSABS  --  0.0  BASOSABS  --  0.0   ------------------------------------------------------------------------------------------------------------------  Chemistries   Recent Labs Lab 04/18/15 1542 04/19/15 0551 04/20/15 0613 04/21/15 0229 04/22/15 0543  NA 129* 133* 133* 131* 133*  K 3.8 4.7  --  4.1 4.4  CL 95* 99*  --  95* 97*  CO2 24 28  --  30 32  GLUCOSE 126* 147*  --  178* 158*  BUN 17 18  --  27* 26*  CREATININE 0.97 0.99 1.01 1.01 0.97  CALCIUM 8.7* 8.7*  --  8.1* 7.9*  AST 25  --   --   --   --   ALT 23  --   --   --   --   ALKPHOS 83  --   --   --   --   BILITOT  --   --  1.0  --   --    ------------------------------------------------------------------------------------------------------------------  estimated creatinine clearance is 59.6 mL/min (by C-G formula based on Cr of 0.97). ------------------------------------------------------------------------------------------------------------------ No results for input(s): TSH, T4TOTAL, T3FREE, THYROIDAB in the last 72 hours.  Invalid input(s): FREET3  Cardiac Enzymes No results for input(s): CKMB, TROPONINI, MYOGLOBIN in the last 168 hours.  Invalid input(s):  CK ------------------------------------------------------------------------------------------------------------------ Invalid input(s): POCBNP ---------------------------------------------------------------------------------------------------------------  RADIOLOGY: No results found.  EKG: No orders found for this or any previous visit.  ASSESSMENT AND PLAN:  1. acute on chronci respiratory failure with hypoxia, due to acute CHF, ? COPD exacerbation, contienu lasix, about 6.4  liters negative since admission, stable Cr, oxygenation has improved some, dc Amiodarone, as it can cause lung toxicity as well, will get DR Laurelyn Sickle involved  for recommendations and delineation why pt was on amiodarone in the first place,  Now weaned off to [place], O2 sats 82 % on 6 liters O2 per [name] at rest, 70 % on exertion, awaiting for  DR Humphrey Rolls, pulmonary,  input with recommendations, overall some clinically better. Dc Amiodarone.  2 acute diastolic CHf, continue low dose lasix PO, BP control is adequate, following clinically.  3. COPD exacerbation, had wheezing anteriorly initially, subsided now, continue tapering medrol dose pack , contineu inhalers, levaquin PO, get DR Humphrey Rolls to see pt as well.  4.hyponatremia, improved with diuresis initially, remains l low at 133 today, likely [place]?, liberalize fluid restriction  DRUG ALLERGIES: No Known Allergies  CODE STATUS:     Code Status Orders        Start     Ordered   04/19/15 1313  Full code   Continuous     04/19/15 1318      TOTAL TIME TAKING CARE OF THIS PATIENT: 40  minutes.    Theodoro Grist M.D on 04/23/2015 at 10:50 AM  Between 7am to 6pm - Pager - [phone]  After 6pm go to [email or web address] - password [email or web address] [facility]  [place] [facility]  Office  [phone]  CC: Primary care physician; [facility], Chrissie Noa, MD

## 2015-04-23 NOTE — Consult Note (Signed)
Pulmonary Critical Care  Initial Consult Note   Brett Burton HYQ:[id number] DOB: 1929-03-05 DOA: 04/18/2015  Referring physician: Theodoro Grist, MD PCP: [facility], MD   Chief Complaint: Shortness of breath  HPI: Brett Burton is a 81 y.o. male with history of COPD presented with increased shortness of breath and cough. Patient states that he had been at his cardiologists office and was noted to have low saturations. He was there for a routine echo. He had noted some increased cough and congestion noted. Patient had no fevers noted he had no chest pain noted. He has had no hemoptysis noted. He was sent to the ED and was noted at that time to also have low saturations in the 70s. A CXR done shows mild pneumonitis and a CT scan was done which shows no evidence of PE. He was admitted for further therapy.   Review of Systems:  Constitutional:  No weight loss, night sweats, Fevers.  HEENT:  No headaches, No sneezing, itching, ear ache, nasal congestion, post nasal drip,  Cardio-vascular:  No chest pain, Orthopnea, PND, swelling in lower extremities GI:  No heartburn, indigestion, abdominal pain, nausea, vomiting, diarrhea Resp:  +shortness of breath with exertion. +productive cough, No coughing up of blood  Skin:  no rash or lesions.  Remainder of ROS was negative other than noted above in HPI   No past medical history on file. No past surgical history on file. Social History:  has no tobacco, alcohol, and drug history on file.  No Known Allergies  No family history on file.  Prior to Admission medications   Medication Sig Start Date End Date Taking? Authorizing Provider  albuterol-ipratropium (COMBIVENT) 18-103 MCG/ACT inhaler Inhale 1 puff into the lungs once. Once a day for Shortness of breath as needed   Yes Historical Provider, MD  amiodarone (PACERONE) 200 MG tablet Take 200 mg by mouth daily.   Yes Historical Provider, MD  amLODipine (NORVASC) 10 MG tablet Take 10  mg by mouth daily.   Yes Historical Provider, MD  aspirin 81 MG tablet Take 81 mg by mouth daily.   Yes Historical Provider, MD  atorvastatin (LIPITOR) 40 MG tablet Take 40 mg by mouth daily.   Yes Historical Provider, MD  carvedilol (COREG) 3.125 MG tablet Take 3.125 mg by mouth 2 (two) times daily with a meal.   Yes Historical Provider, MD  clopidogrel (PLAVIX) 75 MG tablet Take 75 mg by mouth daily.   Yes Historical Provider, MD  Fluticasone-Salmeterol (ADVAIR) 500-50 MCG/DOSE AEPB Inhale into the lungs 2 (two) times daily.   Yes Historical Provider, MD  tiotropium (SPIRIVA) 18 MCG inhalation capsule Place 18 mcg into inhaler and inhale daily. 1 cap(s) inhaled once a day   Yes Historical Provider, MD   Physical Exam: Filed Vitals:   04/23/15 0026 04/23/15 0557 04/23/15 0842 04/23/15 1249  BP: 119/61  112/55   Pulse: 62  57   Temp: 98.4 F (36.9 C)  97.9 F (36.6 C)   TempSrc:   Oral   Resp: 18  20   Height:      Weight:  79.379 kg (175 lb)    SpO2: 89%  92% 82%    Wt Readings from Last 3 Encounters:  04/23/15 79.379 kg (175 lb)    General:  Appears calm and comfortable Eyes: PERRL, normal lids, irises & conjunctiva ENT: grossly normal hearing, lips & tongue Neck: no LAD, masses or thyromegaly Cardiovascular: RRR, no m/r/g. No LE edema.  Respiratory: CTA overall with few ronchi. Normal respiratory effort. Abdomen: soft non-tender Skin: no rash or induration seen on limited exam Musculoskeletal: grossly normal tone BUE/BLE Psychiatric: grossly normal mood and affect Neurologic: grossly non-focal.          Labs on Admission:  Basic Metabolic Panel:  Recent Labs Lab 04/18/15 1542 04/19/15 0551 04/20/15 0613 04/21/15 0229 04/22/15 0543  NA 129* 133* 133* 131* 133*  K 3.8 4.7  --  4.1 4.4  CL 95* 99*  --  95* 97*  CO2 24 28  --  30 32  GLUCOSE 126* 147*  --  178* 158*  BUN 17 18  --  27* 26*  CREATININE 0.97 0.99 1.01 1.01 0.97  CALCIUM 8.7* 8.7*  --  8.1* 7.9*    Liver Function Tests:  Recent Labs Lab 04/18/15 1542 04/20/15 0613  AST 25  --   ALT 23  --   ALKPHOS 83  --   BILITOT  --  1.0  PROT 7.5  --   ALBUMIN 3.9  --    No results for input(s): LIPASE, AMYLASE in the last 168 hours. No results for input(s): AMMONIA in the last 168 hours. CBC:  Recent Labs Lab 04/18/15 1542 04/19/15 0551  WBC 7.6 4.0  NEUTROABS  --  3.3  HGB 15.6 14.7  HCT 47.1 42.9  MCV 87 91  PLT 228 213   Cardiac Enzymes: No results for input(s): CKTOTAL, CKMB, CKMBINDEX, TROPONINI in the last 168 hours.  BNP (last 3 results) No results for input(s): BNP in the last 8760 hours.  ProBNP (last 3 results) No results for input(s): PROBNP in the last 8760 hours.  CBG: No results for input(s): GLUCAP in the last 168 hours.  Radiological Exams on Admission:   CLINICAL DATA: Hypoxia, emphysema, dyspnea  EXAM: CHEST 2 VIEW  COMPARISON: 04/18/2015  FINDINGS: Cardiomediastinal silhouette is stable. Again noted reticular interstitial prominence especially in lower lobes. Although findings may be due chronic interstitial lung disease superimposed mild pneumonitis cannot be excluded. No convincing pulmonary edema. No focal infiltrate. Hyperinflation. Thoracic spine osteopenia.  IMPRESSION: Again noted reticular interstitial prominence especially in lower lobes. Although findings may be due chronic interstitial lung disease superimposed mild pneumonitis cannot be excluded. No convincing pulmonary edema. No focal infiltrate. Hyperinflation.   Electronically Signed  By: Lahoma Crocker M.D.  On: 04/21/2015 09:20      CLINICAL DATA: Shortness of breath. From pulmonologist office. Decreased oxygen saturation. O2 sat of 68% on 4 L of oxygen. Tachypneic.  EXAM: CT ANGIOGRAPHY CHEST WITH CONTRAST  TECHNIQUE: Multidetector CT imaging of the chest was performed using the standard protocol during bolus administration of  intravenous contrast. Multiplanar CT image reconstructions and MIPs were obtained to evaluate the vascular anatomy.  CONTRAST: 75 cc Omnipaque 350  COMPARISON: Chest x-ray 04/18/2015  FINDINGS: Heart: Significant coronary artery calcification. Heart is normal in size. No pericardial effusion.  Vascular structures: The pulmonary arteries are well opacified. No evidence for acute pulmonary embolus.  Mediastinum/thyroid: The visualized portion of the thyroid gland has a normal appearance. There is enlargement of numerous mediastinal lymph nodes. Precarinal lymph node is 1.5 cm. Right hilar lymph nodes are 1.2 1.5 cm. Subcarinal lymph node is 1.4 cm. Left hilar lymph node is 1.4 cm.  Lungs/Airways: There is significant emphysematous change throughout the lungs. Fibrotic changes are identified at the lung bases. There is superimposed density within the normal lung parenchyma which appears fairly diffuse, favoring edema over infectious process.  Upper abdomen: Stable enlargement of both adrenal glands.  Chest wall/osseous structures: Mid thoracic spondylosis. No suspicious lytic or blastic lesions are identified.  Review of the MIP images confirms the above findings.  IMPRESSION: 1. Significant coronary artery calcification. 2. Technically adequate exam showing no pulmonary embolus. 3. Significant emphysema and underlying airspace filling, favoring edema over infectious process. 4. Enlarged mediastinal and hilar lymph nodes may be reactive given the degree of emphysema and fibrosis. Some degree of adenopathy can also be seen with pulmonary edema. However, consider follow-up, as it is difficult to exclude malignancy. 5. Stable enlargement of the adrenal glands.   Electronically Signed  By: Nolon Nations M.D.  On: 04/18/2015 20:14  Assessment/Plan Active Problems:   Acute on chronic respiratory failure with hypoxemia   1. Acute on Chronic Respiratory  failure with Hypoxia -will continue with oxygen therapy' -clinically improving on present therapy -follow ABG as needed  2. COPD with exacerbation -on appropriate inhalers dulera spiriva -continue with antibiotics -will continue with oxygen therapy -continue with steroids  3. Hyponatremia -improving follow labs  Code Status: Full Code (must indicate code status--if unknown or must be presumed, indicate so) DVT Prophylaxis:on plavix Family Communication: wife and son (indicate person spoken with, if applicable, with phone number if by telephone) Disposition Plan: Home (indicate anticipated LOS)  Follow Up: Please have patient follow uup 1-2 weeks after discharge in the office [phone]  Time spent: 54min    I have personally obtained a history, examined the patient, evaluated laboratory and imaging results, formulated the assessment and plan and placed orders.  The Patient requires high complexity decision making for assessment and support.    Allyne Gee, MD [facility] Pulmonary Critical Care Medicine Sleep Medicine

## 2015-04-24 DIAGNOSIS — J441 Chronic obstructive pulmonary disease with (acute) exacerbation: Secondary | ICD-10-CM

## 2015-04-24 DIAGNOSIS — E871 Hypo-osmolality and hyponatremia: Secondary | ICD-10-CM

## 2015-04-24 DIAGNOSIS — I5031 Acute diastolic (congestive) heart failure: Secondary | ICD-10-CM

## 2015-04-24 MED ORDER — LEVOFLOXACIN 500 MG PO TABS
500.0000 mg | ORAL_TABLET | Freq: Every day | ORAL | Status: DC
Start: 2015-04-24 — End: 2015-10-08

## 2015-04-24 MED ORDER — METHYLPREDNISOLONE 4 MG PO TBPK: ORAL_TABLET | ORAL | Status: DC

## 2015-04-24 MED ORDER — FUROSEMIDE 20 MG PO TABS: 20.0000 mg | ORAL_TABLET | Freq: Every day | ORAL | Status: DC

## 2015-04-24 NOTE — Progress Notes (Signed)
Patient  discharged to Home with home health. alert and oriented. VSS.  Discharge instructions given. Patient verbalized understanding.

## 2015-04-24 NOTE — Consult Note (Signed)
Brett Burton is Burton 81 y.o. male  [id number]  Referring Physician: Ether Burton Primary Physician: Brett Burton Primary Cardiologist: Brett Burton Reason for Consultation: Shortness of breath  HPI: This is Burton 81 year old white male with Burton past medical history of COPD presented to our office with severe shortness of breath. He was referred to some concrete and was afterwards sent to the emergency room because the oxygen saturation was 70% patient states that he was feeling very short of breath for the past couple of weeks. He also has been on amiodarone for atrial fibrillation 200 mg once Burton day. Question was whether that we should continue amiodarone given his pulmonary status. Patient is feeling much better since she's been in the in the hospital and on oxygen and right now is almost feeling like he wants to go home. I discussed with Dr. Ether Burton about his continuation or discontinuation of amiodarone. And it was decided that we will discontinue amiodarone and I will see him in the office on Monday at 10:00. At that time we'll choose Burton different antiarrhythmic or just put him on metoprolol for his atrial fibrillation.  Review of [place] is Burton 81 y.o. male patient.  1. Dyspnea     No past medical history on file.  Current Facility-Administered Medications  Medication Dose Route Frequency Provider Last Rate Last Dose  . acetaminophen (TYLENOL) tablet 650 mg  650 mg Oral Q4H PRN Doctor Chlconversion, MD      . amLODipine (NORVASC) tablet 10 mg  10 mg Oral Daily Doctor Chlconversion, MD   10 mg at 04/23/15 1025  . aspirin EC tablet 81 mg  81 mg Oral Daily Doctor Chlconversion, MD   81 mg at 04/23/15 1025  . atorvastatin (LIPITOR) tablet 40 mg  40 mg Oral q1800 Doctor Chlconversion, MD   40 mg at 04/23/15 1805  . calcium carbonate (TUMS - dosed in mg elemental calcium) chewable tablet 400 mg of elemental calcium  2 tablet Oral Q6H PRN Brett Coon, MD      . carvedilol (COREG) tablet  3.125 mg  3.125 mg Oral BID WC Doctor Chlconversion, MD   3.125 mg at 04/23/15 1805  . clopidogrel (PLAVIX) tablet 75 mg  75 mg Oral Daily Doctor Chlconversion, MD   75 mg at 04/23/15 1025  . enoxaparin (LOVENOX) injection 40 mg  40 mg Subcutaneous Q24H Doctor Chlconversion, MD   40 mg at 04/23/15 2122  . ipratropium-albuterol (DUONEB) 0.5-2.5 (3) MG/3ML nebulizer solution 3 mL  3 mL Nebulization Q6H PRN Theodoro Grist, MD      . levofloxacin (LEVAQUIN) tablet 500 mg  500 mg Oral q1800 Doctor Chlconversion, MD   500 mg at 04/23/15 1805  . methylPREDNISolone (MEDROL) tablet 4 mg  4 mg Oral 4X daily taper Theodoro Grist, MD   4 mg at 04/23/15 2123  . mometasone-formoterol (DULERA) 200-5 MCG/ACT inhaler 2 puff  2 puff Inhalation BID Doctor Chlconversion, MD   2 puff at 04/23/15 2123  . ondansetron (ZOFRAN) injection 4 mg  4 mg Intravenous Q4H PRN Doctor Chlconversion, MD      . tiotropium [facility]) inhalation capsule 18 mcg  18 mcg Inhalation Daily Doctor Chlconversion, MD   18 mcg at 04/23/15 1127   No Known Allergies Principal Problem:   Acute diastolic CHF (congestive heart failure) Active Problems:   Acute on chronic respiratory failure with hypoxemia   COPD exacerbation   Hyponatremia  Blood pressure 125/68, pulse 62, temperature 98.2 F (  36.8 C), temperature source Oral, resp. rate 18, height 5\' 10"  (1.778 m), weight 79.198 kg (174 lb 9.6 oz), SpO2 91 %.  ROS  Physical Exam  Brett Burton 04/24/2015    No past medical history on file.  Medications Prior to Admission  Medication Sig Dispense Refill  . albuterol-ipratropium (COMBIVENT) 18-103 MCG/ACT inhaler Inhale 1 puff into the lungs once. Once Burton day for Shortness of breath as needed    . amiodarone (PACERONE) 200 MG tablet Take 200 mg by mouth daily.    Marland Kitchen amLODipine (NORVASC) 10 MG tablet Take 10 mg by mouth daily.    Marland Kitchen aspirin 81 MG tablet Take 81 mg by mouth daily.    Marland Kitchen atorvastatin (LIPITOR) 40 MG tablet Take 40 mg by mouth  daily.    . carvedilol (COREG) 3.125 MG tablet Take 3.125 mg by mouth 2 (two) times daily with Burton meal.    . clopidogrel (PLAVIX) 75 MG tablet Take 75 mg by mouth daily.    . Fluticasone-Salmeterol (ADVAIR) 500-50 MCG/DOSE AEPB Inhale into the lungs 2 (two) times daily.    Marland Kitchen tiotropium (SPIRIVA) 18 MCG inhalation capsule Place 18 mcg into inhaler and inhale daily. 1 cap(s) inhaled once Burton day       . amLODipine  10 mg Oral Daily  . aspirin EC  81 mg Oral Daily  . atorvastatin  40 mg Oral q1800  . carvedilol  3.125 mg Oral BID WC  . clopidogrel  75 mg Oral Daily  . enoxaparin (LOVENOX) injection  40 mg Subcutaneous Q24H  . levofloxacin  500 mg Oral q1800  . methylPREDNISolone  4 mg Oral 4X daily taper  . mometasone-formoterol  2 puff Inhalation BID  . tiotropium  18 mcg Inhalation Daily    Infusions:    No Known Allergies  History   Social History  . Marital Status: Married    Spouse Name: N/Burton  . Number of Children: N/Burton  . Years of Education: N/Burton   Occupational History  . Not on file.   Social History Main Topics  . Smoking status: Not on file  . Smokeless tobacco: Not on file  . Alcohol Use: Not on file  . Drug Use: Not on file  . Sexual Activity: Not on file   Other Topics Concern  . Not on file   Social History Narrative  . No narrative on file    No family history on file.  PHYSICAL EXAM:  Filed Vitals:   04/23/15 2345  BP: 125/68  Pulse: 62  Temp: 98.2 F (36.8 C)  Resp: 18     Intake/Output Summary (Last 24 hours) at 04/24/15 0835 Last data filed at 04/24/15 0520  Gross per 24 hour  Intake    716 ml  Output   2400 ml  Net  -1684 ml    General:  Well appearing. No respiratory difficulty HEENT: normal Neck: supple. no JVD. Carotids 2+ bilat; no bruits. No lymphadenopathy or thryomegaly appreciated. Cor: PMI nondisplaced. Regular rate & rhythm. No rubs, gallops or murmurs. Lungs: clear Abdomen: soft, nontender, nondistended. No  hepatosplenomegaly. No bruits or masses. Good bowel sounds. Extremities: no cyanosis, clubbing, rash, edema Neuro: alert & oriented x 3, cranial nerves grossly intact. moves all 4 extremities w/o difficulty. Affect pleasant.  ECG:   NSR RBB, no acute changes  No results found for this or any previous visit (from the past 24 hour(s)). No results found.   ASSESSMENT AND PLAN: Hypoxia/COPD, Patient is feeling  much better. Advise holding off on amiodrone and f/u office Monday at [place]

## 2015-04-24 NOTE — Discharge Summary (Signed)
[place] at [place] NAME: Brett Burton    MR#:  [id number]  DATE OF BIRTH:  [birth year]  DATE OF ADMISSION:  04/18/2015 ADMITTING PHYSICIAN: Henreitta Leber, MD  DATE OF DISCHARGE: 5.5.16 PRIMARY CARE PHYSICIAN: [name], [name], MD     ADMISSION DIAGNOSIS:  CHRONIC RESPIRATORY FAILURE  DISCHARGE DIAGNOSIS:  Principal Problem:   Acute diastolic CHF (congestive heart failure) Active Problems:   Acute on chronic respiratory failure with hypoxemia   COPD exacerbation   Hyponatremia   SECONDARY DIAGNOSIS:  No past medical history on file.  .pro HOSPITAL COURSE:   Came in with SOB, hypoxia to 70, CTA chest showed CHF, emphysema, no leg swelling,  was on HFNC, diuresed 7.4 liters over his stay in hospital time, now on weaned to 5 liters O2 per [place].   1. acute on chronci respiratory failure with hypoxia, due to acute CHF, ? COPD exacerbation, continue low dose lasix, diuresed about 7.4 liters, stable Cr, oxygenation has improved ,  dc Amiodarone, as it can cause lung toxicity as well, appreciate  DRs S.and Rennis Harding recommendations. Follow up with both pulmonary and cardiology in few days to ensure progression.  2 acute diastolic CHf, continue low dose lasix PO, BP control is adequate, following clinically.  3. COPD exacerbation, had wheezing anteriorly initially, subsided now, continue tapering medrol dose pack , contineu inhalers, levaquin PO for 6 more days, follow up with DR Humphrey Rolls as outpt  4.hyponatremia, improved with diuresis initially, remains  low at 133 yday, suspected SAIDH, consider fluid restriction as outpt, needs to have labs checked as outpt DISCHARGE CONDITIONS:   fair  CONSULTS OBTAINED:  Treatment Team:  Allyne Gee, MD Dionisio David, MD  DRUG ALLERGIES:  No Known Allergies  DISCHARGE MEDICATIONS:   Current Discharge Medication List    START taking these medications   Details  furosemide  (LASIX) 20 MG tablet Take 1 tablet (20 mg total) by mouth daily. Qty: 30 tablet, Refills: 0    levofloxacin (LEVAQUIN) 500 MG tablet Take 1 tablet (500 mg total) by mouth daily at 6 PM. Qty: 6 tablet, Refills: 0    methylPREDNISolone (MEDROL DOSEPAK) 4 MG TBPK tablet follow package directions Qty: 21 tablet, Refills: 0      CONTINUE these medications which have NOT CHANGED   Details  albuterol-ipratropium (COMBIVENT) 18-103 MCG/ACT inhaler Inhale 1 puff into the lungs once. Once a day for Shortness of breath as needed    amLODipine (NORVASC) 10 MG tablet Take 10 mg by mouth daily.    aspirin 81 MG tablet Take 81 mg by mouth daily.    atorvastatin (LIPITOR) 40 MG tablet Take 40 mg by mouth daily.    carvedilol (COREG) 3.125 MG tablet Take 3.125 mg by mouth 2 (two) times daily with a meal.    clopidogrel (PLAVIX) 75 MG tablet Take 75 mg by mouth daily.    Fluticasone-Salmeterol (ADVAIR) 500-50 MCG/DOSE AEPB Inhale into the lungs 2 (two) times daily.    tiotropium (SPIRIVA) 18 MCG inhalation capsule Place 18 mcg into inhaler and inhale daily. 1 cap(s) inhaled once a day      STOP taking these medications     amiodarone (PACERONE) 200 MG tablet          DISCHARGE INSTRUCTIONS:    Follow up with PCP, cardiology, pulmonary as outpt in few days.  Diet: low fat, low salt  If you experience worsening of your  admission symptoms, develop shortness of breath, life threatening emergency, suicidal or homicidal thoughts you must seek medical attention immediately by calling 911 or calling your MD immediately  if symptoms less severe.  You Must read complete instructions/literature along with all the possible adverse reactions/side effects for all the Medicines you take and that have been prescribed to you. Take any new Medicines after you have completely understood and accept all the possible adverse reactions/side effects.   Please note  You were cared for by a hospitalist during  your hospital stay. If you have any questions about your discharge medications or the care you received while you were in the hospital after you are discharged, you can call the unit and asked to speak with the hospitalist on call if the hospitalist that took care of you is not available. Once you are discharged, your primary care physician will handle any further medical issues. Please note that NO REFILLS for any discharge medications will be authorized once you are discharged, as it is imperative that you return to your primary care physician (or establish a relationship with a primary care physician if you do not have one) for your aftercare needs so that they can reassess your need for medications and monitor your lab values.    Today   CHIEF COMPLAINT:  No chief complaint on file.   HISTORY OF PRESENT ILLNESS:  Brett Burton  is a 81 y.o. male with a known history of  COPD on chronic home O2, history of coronary artery disease status post stent placement, hyperlipidemia came in with SOB, hypoxia to 70, CTA chest showed CHF, emphysema, no leg swelling,  was on HFNC, diuresed 7.4 liters over his stay in hospital time, now on weaned to 5 liters O2 per [place].   1. acute on chronci respiratory failure with hypoxia, due to acute CHF, ? COPD exacerbation, continue low dose lasix, diuresed about 7.4 liters, stable Cr, oxygenation has improved ,  dc Amiodarone, as it can cause lung toxicity as well, appreciate  DRs S.and Rennis Harding recommendations. Follow up with both pulmonary and cardiology in few days to ensure progression.  2 acute diastolic CHf, continue low dose lasix PO, BP control is adequate, following clinically.  3. COPD exacerbation, had wheezing anteriorly initially, subsided now, continue tapering medrol dose pack , contineu inhalers, levaquin PO for 6 more days, follow up with DR Humphrey Rolls as outpt  4.hyponatremia, improved with diuresis initially, remains  low at 133 yday, suspected SAIDH,  consider fluid restriction as outpt, needs to have labs checked as outpt   VITAL SIGNS:  Blood pressure 124/64, pulse 63, temperature 98.3 F (36.8 C), temperature source Oral, resp. rate 19, height 5\' 10"  (1.778 m), weight 79.198 kg (174 lb 9.6 oz), SpO2 90 %.  I/O:    Intake/Output Summary (Last 24 hours) at 04/24/15 0902 Last data filed at 04/24/15 0849  Gross per 24 hour  Intake    716 ml  Output   2400 ml  Net  -1684 ml    PHYSICAL EXAMINATION:  GENERAL:  81 y.o.-year-old patient lying in the bed with no acute distress.  EYES: Pupils equal, round, reactive to light and accommodation. No scleral icterus. Extraocular muscles intact.  HEENT: Head atraumatic, normocephalic. Oropharynx and nasopharynx clear.  NECK:  Supple, no jugular venous distention. No thyroid enlargement, no tenderness.  LUNGS: Normal breath sounds bilaterally, no wheezing, few crepitations anteriorly. No use of accessory muscles of respiration.  CARDIOVASCULAR: S1, S2 normal. No  murmurs, rubs, or gallops.  ABDOMEN: Soft, non-tender, non-distended. Bowel sounds present. No organomegaly or mass.  EXTREMITIES: No pedal edema, cyanosis, or clubbing.  NEUROLOGIC: Cranial nerves II through XII are intact. Muscle strength 5/5 in all extremities. Sensation intact. Gait not checked.  PSYCHIATRIC: The patient is alert and oriented x 3.  SKIN: No obvious rash, lesion, or ulcer.   DATA REVIEW:   CBC  Recent Labs Lab 04/19/15 0551  WBC 4.0  HGB 14.7  HCT 42.9  PLT 213    Chemistries   Recent Labs Lab 04/18/15 1542  04/20/15 0613  04/22/15 0543  NA 129*  < > 133*  < > 133*  K 3.8  < >  --   < > 4.4  CL 95*  < >  --   < > 97*  CO2 24  < >  --   < > 32  GLUCOSE 126*  < >  --   < > 158*  BUN 17  < >  --   < > 26*  CREATININE 0.97  < > 1.01  < > 0.97  CALCIUM 8.7*  < >  --   < > 7.9*  AST 25  --   --   --   --   ALT 23  --   --   --   --   ALKPHOS 83  --   --   --   --   BILITOT  --   --  1.0  --    --   < > = values in this interval not displayed.  Cardiac Enzymes No results for input(s): TROPONINI in the last 168 hours.  Microbiology Results  Results for orders placed or performed during the hospital encounter of 04/18/15  Culture, blood (single)     Status: None   Collection Time: 04/18/15  6:27 PM  Result Value Ref Range Status   Micro Text Report   Final       ORGANISM 1                COAGULASE NEGATIVE STAPHYLOCOCCUS   COMMENT                   IN ANAEROBE BOTTLE ONLY   COMMENT                   POSSIBLE CONTAMINATION W/SKIN FLORA   GRAM STAIN                GRAM POSITIVE COCCI IN CLUSTERS   ANTIBIOTIC                                                      Culture, blood (single)     Status: None (Preliminary result)   Collection Time: 04/18/15  6:27 PM  Result Value Ref Range Status   Micro Text Report   Preliminary       COMMENT                   NO GROWTH IN 18-24 HOURS   ANTIBIOTIC  RADIOLOGY:  No results found.  EKG:  No orders found for this or any previous visit.    Management plans discussed with the patient, family and they are in agreement.  CODE STATUS:     Code Status Orders        Start     Ordered   04/19/15 1313  Full code   Continuous     04/19/15 1318      TOTAL TIME TAKING CARE OF THIS PATIENT: 40 minutes.    Theodoro Grist M.D on 04/24/2015 at 9:02 AM  Between 7am to 6pm - Pager - [phone]  After 6pm go to [email or web address] - password [email or web address] [facility]  [facility]  Office  [phone]  CC: Primary care physician; [facility], Chrissie Noa, MD

## 2015-04-24 NOTE — Care Management Note (Signed)
Case Management Note  Patient Details  Name: Jiovanny Burdell MRN: [id number] Date of Birth: 1929-02-24  Subjective/Objective:     Discharging today. Pt has his portable O2 tank for discharge.           Action/Plan: Updated pt and son as well as left message with wife regarding POC. [facility] to follow with nursing at discharge.   Expected Discharge Date:   04/24/2015               Expected Discharge Plan:  [place]  In-House Referral:    CM Discharge planning Services  CM Consult  Post Acute Care Choice:   [facility] Choice offered to:  Patient, Spouse  DME Arranged:    DME Agency:     HH Arranged:  RN, Disease Management [place] Agency:  [place]  Status of Service: Complete Medicare Important Message Given:    Date Medicare IM Given:    Medicare IM give by:    Date Additional Medicare IM Given:    Additional Medicare Important Message give by:     If discussed at [place] of Stay Meetings, dates discussed:    Additional Comments:  Jolly Mango, RN 04/24/2015, 10:04 AM

## 2015-04-24 NOTE — Discharge Instructions (Signed)
congestive heart failure

## 2015-04-24 NOTE — Progress Notes (Signed)
[facility] to follow with nursing and physical therapy.

## 2015-04-24 NOTE — Progress Notes (Signed)
CM in to speak with wife face to face, updated on POC. She is agreeable. [facility] in to speak to wife.

## 2015-06-23 ENCOUNTER — Emergency Department: Payer: Medicare Other

## 2015-06-23 ENCOUNTER — Other Ambulatory Visit: Payer: Self-pay

## 2015-06-23 ENCOUNTER — Emergency Department
Admission: EM | Admit: 2015-06-23 | Discharge: 2015-06-23 | Disposition: A | Discharge status: Home / Self Care | Payer: Medicare Other | Attending: Emergency Medicine | Admitting: Emergency Medicine

## 2015-06-23 DIAGNOSIS — I10 Essential (primary) hypertension: Secondary | ICD-10-CM | POA: Diagnosis not present

## 2015-06-23 DIAGNOSIS — Z7982 Long term (current) use of aspirin: Secondary | ICD-10-CM | POA: Diagnosis not present

## 2015-06-23 DIAGNOSIS — Z79899 Other long term (current) drug therapy: Secondary | ICD-10-CM | POA: Insufficient documentation

## 2015-06-23 DIAGNOSIS — R0602 Shortness of breath: Secondary | ICD-10-CM | POA: Diagnosis present

## 2015-06-23 DIAGNOSIS — Z792 Long term (current) use of antibiotics: Secondary | ICD-10-CM | POA: Insufficient documentation

## 2015-06-23 DIAGNOSIS — J441 Chronic obstructive pulmonary disease with (acute) exacerbation: Secondary | ICD-10-CM | POA: Diagnosis not present

## 2015-06-23 DIAGNOSIS — I5033 Acute on chronic diastolic (congestive) heart failure: Secondary | ICD-10-CM | POA: Insufficient documentation

## 2015-06-23 DIAGNOSIS — Z87891 Personal history of nicotine dependence: Secondary | ICD-10-CM | POA: Insufficient documentation

## 2015-06-23 DIAGNOSIS — I509 Heart failure, unspecified: Secondary | ICD-10-CM

## 2015-06-23 HISTORY — DX: Chronic obstructive pulmonary disease, unspecified: J44.9

## 2015-06-23 HISTORY — DX: Essential (primary) hypertension: I10

## 2015-06-23 HISTORY — DX: Malignant melanoma of skin, unspecified: C43.9

## 2015-06-23 LAB — BASIC METABOLIC PANEL
Anion gap: 9 (ref 5–15)
BUN: 13 mg/dL (ref 6–20)
CO2: 26 mmol/L (ref 22–32)
CREATININE: 1.07 mg/dL (ref 0.61–1.24)
Calcium: 8.7 mg/dL — ABNORMAL LOW (ref 8.9–10.3)
Chloride: 100 mmol/L — ABNORMAL LOW (ref 101–111)
GFR calc non Af Amer: >60 mL/min (ref >60)
Glucose, Bld: 147 mg/dL — ABNORMAL HIGH (ref 65–99)
Potassium: 4.3 mmol/L (ref 3.5–5.1)
Sodium: 135 mmol/L (ref 135–145)

## 2015-06-23 LAB — BRAIN NATRIURETIC PEPTIDE: B Natriuretic Peptide: 315 pg/mL — ABNORMAL HIGH (ref 0.0–100.0)

## 2015-06-23 LAB — CBC WITH DIFFERENTIAL/PLATELET
BASOS PCT: 1 %
Basophils Absolute: 0 10*3/uL (ref 0–0.1)
EOS ABS: 0.5 10*3/uL (ref 0–0.7)
EOS PCT: 6 %
HEMATOCRIT: 47.2 % (ref 40.0–52.0)
Hemoglobin: 15.4 g/dL (ref 13.0–18.0)
Lymphocytes Relative: 16 %
Lymphs Abs: 1.4 10*3/uL (ref 1.0–3.6)
MCH: 27.5 pg (ref 26.0–34.0)
MCHC: 32.7 g/dL (ref 32.0–36.0)
MCV: 84 fL (ref 80.0–100.0)
MONO ABS: 0.8 10*3/uL (ref 0.2–1.0)
Monocytes Relative: 9 %
Neutro Abs: 5.7 10*3/uL (ref 1.4–6.5)
Neutrophils Relative %: 68 %
Platelets: 214 10*3/uL (ref 150–440)
RBC: 5.62 MIL/uL (ref 4.40–5.90)
RDW: 17.1 % — ABNORMAL HIGH (ref 11.5–14.5)
WBC: 8.3 10*3/uL (ref 3.8–10.6)

## 2015-06-23 LAB — TROPONIN I

## 2015-06-23 MED ORDER — PREDNISONE 20 MG PO TABS: ORAL_TABLET | ORAL | Status: DC

## 2015-06-23 MED ORDER — IPRATROPIUM-ALBUTEROL 0.5-2.5 (3) MG/3ML IN SOLN
RESPIRATORY_TRACT | Status: DC
Start: 2015-06-23 — End: 2015-06-24
  Filled 2015-06-23: qty 3

## 2015-06-23 MED ORDER — AZITHROMYCIN 500 MG PO TABS: 500.0000 mg | ORAL_TABLET | Freq: Every day | ORAL | Status: AC

## 2015-06-23 MED ORDER — IPRATROPIUM-ALBUTEROL 0.5-2.5 (3) MG/3ML IN SOLN
3.0000 mL | Freq: Once | RESPIRATORY_TRACT | Status: AC
  Administered 2015-06-23: 3 mL via RESPIRATORY_TRACT

## 2015-06-23 MED ORDER — METHYLPREDNISOLONE SODIUM SUCC 125 MG IJ SOLR
80.0000 mg | Freq: Once | INTRAMUSCULAR | Status: AC
  Administered 2015-06-23: 80 mg via INTRAVENOUS

## 2015-06-23 MED ORDER — METHYLPREDNISOLONE SODIUM SUCC 125 MG IJ SOLR
INTRAMUSCULAR | Status: AC
  Administered 2015-06-23: 80 mg via INTRAVENOUS
  Filled 2015-06-23: qty 2

## 2015-06-23 NOTE — Discharge Instructions (Signed)
Your low oxygen saturation levels have improved after a nebulizer treatment with DuoNeb - the same medicines that are in your Combivent.  Continue use your Combivent as needed. If you're oxygen saturation level does not improve after 2 puffs she may take 2 more puffs 15 minutes later. If you are having difficulty breathing or any other urgent concerns, return to the emergency department.  Take prednisone as prescribed, take azithromycin.  Follow-up with your regular doctor in 1-2 days.   Chronic Respiratory Failure Respiratory failure is when your lungs are not working well and your breathing (respiratory) system fails. When respiratory failure occurs, it is difficult for your lungs to get enough oxygen or get rid of carbon dioxide or both. Respiratory failure can be life threatening.  Respiratory failure can be acute or chronic. Acute respiratory failure is sudden, severe, and requires emergency medical treatment. Chronic respiratory failure is less severe, happens over time, and requires ongoing treatment.  CAUSES  Any problem affecting the heart or lungs can cause respiratory failure. Some of these causes may be:  Chronic bronchitis and emphysema (COPD).  Blood clot going to the lung (pulmonary embolism).  Having water in the lungs caused by heart failure, lung injury, or infection (pulmonary edema).  Collapsed lung (pneumothorax).  Pneumonia.  Pulmonary fibrosis.  Obesity.  Asthma.  Heart failure.  Any type of trauma to the chest that can make breathing difficult.  Nerve or muscle diseases making chest movements difficult. SYMPTOMS  Signs and symptoms of chronic respiratory failure include:  Shortness of breath (dyspnea) with or without activity.  Rapid, fast breathing (tachypnea).  Wheezing.  Fast heart rate.  Bluish color to the fingernail or toenail beds.  Confusion or drowsiness or both. DIAGNOSIS  Initial diagnosis requires a thorough history and a physical  exam by your health care provider. Additional tests may include:  Chest X-ray.  CT scan of your lungs.  Ultrasound to check for blood clots.  Blood tests, such as an arterial blood gas test (ABG). This is a blood test that looks at the oxygen and carbon dioxide levels in your arterial blood.  Your vital signs will be taken. This includes your respiratory rate (how many times a minute you are breathing), oxygen saturation (this measures the oxygen level in your blood), heart rate, and blood pressure. These numbers help your health care provider determine the next steps.  Electrocardiogram. TREATMENT  Treatment of chronic respiratory failure depends on the cause of the respiratory failure. Treatment can include the following:  Oxygen. Oxygen can be delivered through the following:  Nasal cannula. This is small tubing that goes in your nose to give you oxygen.  Face mask. A face mask covers your nose and mouth to give you oxygen.  Medicine. Different medicines can be given to help with breathing. These can include:  Nebulizers. Nebulizers deliver medicines to open the air passages (bronchodilators). These medicines help to open or relax the airways in the lungs so you can breathe better. They can also help loosen mucus from your lungs.  Diuretics. Diuretic medicines can help you breathe better by getting rid of extra fluid in your body.  Steroids. Steroid medicines can help decrease inflammation in your lungs.  Chest tube. If you have a collapsed lung (pneumothorax), a chest tube is placed to help reinflate the lung.  Non-invasive positive pressure ventilation (NPPV). This is a tight-fitting mask that goes over your nose and mouth. The mask has tubing that is attached to a machine. The  machine blows air into the tubing, which helps to keep the tiny air sacs (alveoli) in your lungs open. This machine allows you to breathe on your own.  Ventilator. A ventilator is a breathing machine.  When on a ventilator, a breathing tube is put into the lungs. A ventilator is used when you can no longer breathe well enough on your own. You may have low oxygen levels or high carbon dioxide (CO2) levels in your blood. When you are on a ventilator, sedation and pain medicines are given to make you sleep so your lungs can heal. HOME CARE INSTRUCTIONS  Follow your health care provider's directions about medicines and respiratory therapy.  Quit smoking if you smoke. SEEK MEDICAL CARE IF:  You have increasing shortness of breath and are less functional than you have been.  You have increased sputum, wheezing, coughing, or loss of energy.  You are on oxygen and are requiring more. SEEK IMMEDIATE MEDICAL CARE IF:  Your shortness of breath is significantly worse.  You are unable to say more than a few words without having to catch your breath.  You are much less functional. MAKE SURE YOU:  Understand these instructions.  Will watch your condition.  Will get help right away if you are not doing well or get worse. Document Released: 12/06/2005 Document Revised: 04/22/2014 Document Reviewed: 10/04/2013 [facility] Patient Information 2015 [place], [place]. This information is not intended to replace advice given to you by your health care provider. Make sure you discuss any questions you have with your health care provider.

## 2015-06-23 NOTE — ED Provider Notes (Signed)
[facility] Emergency Department Provider Note  ____________________________________________  Time seen: 2140  I have reviewed the triage vital signs and the nursing notes.   HISTORY  Chief Complaint Shortness of Breath shortness of breath, hypoxia    HPI Brett Burton is a 81 y.o. male with a history of severe COPD, on 5 L by nasal cannula at home, presents reporting that his pulse ox was reading low at home. He was getting regions below 82%, which is the level he was told to seek care for. He denies any significant change or shortness of breath. He is not having any chest pain. He is alert and communicative.     Past Medical History  Diagnosis Date  . COPD (chronic obstructive pulmonary disease)   . Hypertension   . Melanoma     Patient Active Problem List   Diagnosis Date Noted  . Acute diastolic CHF (congestive heart failure) 04/24/2015  . COPD exacerbation 04/24/2015  . Hyponatremia 04/24/2015  . Acute on chronic respiratory failure with hypoxemia 04/19/2015    Past Surgical History  Procedure Laterality Date  . Colon surgery    . Tonsillectomy    . Appendectomy      Current Outpatient Rx  Name  Route  Sig  Dispense  Refill  . albuterol-ipratropium (COMBIVENT) 18-103 MCG/ACT inhaler   Inhalation   Inhale 1 puff into the lungs once. Once a day for Shortness of breath as needed         . amLODipine (NORVASC) 10 MG tablet   Oral   Take 10 mg by mouth daily.         Marland Kitchen aspirin 81 MG tablet   Oral   Take 81 mg by mouth daily.         Marland Kitchen atorvastatin (LIPITOR) 40 MG tablet   Oral   Take 40 mg by mouth daily.         Marland Kitchen azithromycin (ZITHROMAX) 500 MG tablet   Oral   Take 1 tablet (500 mg total) by mouth daily. Take 1 tablet daily for 3 days.   3 tablet   0   . carvedilol (COREG) 3.125 MG tablet   Oral   Take 3.125 mg by mouth 2 (two) times daily with a meal.         . clopidogrel (PLAVIX) 75 MG tablet   Oral    Take 75 mg by mouth daily.         . Fluticasone-Salmeterol (ADVAIR) 500-50 MCG/DOSE AEPB   Inhalation   Inhale into the lungs 2 (two) times daily.         . furosemide (LASIX) 20 MG tablet   Oral   Take 1 tablet (20 mg total) by mouth daily.   30 tablet   0   . levofloxacin (LEVAQUIN) 500 MG tablet   Oral   Take 1 tablet (500 mg total) by mouth daily at 6 PM.   6 tablet   0   . methylPREDNISolone (MEDROL DOSEPAK) 4 MG TBPK tablet      follow package directions   21 tablet   0   . predniSONE (DELTASONE) 20 MG tablet      Take 2 tablets the first day, then take one tablet a day for 4 more days.   6 tablet   0   . tiotropium (SPIRIVA) 18 MCG inhalation capsule   Inhalation   Place 18 mcg into inhaler and inhale daily. 1 cap(s) inhaled once a day  Allergies Review of patient's allergies indicates no known allergies.  No family history on file.  Social History History  Substance Use Topics  . Smoking status: Former Research scientist (life sciences)  . Smokeless tobacco: Not on file  . Alcohol Use: No    Review of Systems  Constitutional: Negative for fever. ENT: Negative for sore throat. Cardiovascular: Negative for chest pain. Respiratory: Low oxygen saturation levels, but no acute shortness of breath.see history of present illness Gastrointestinal: Negative for abdominal pain, vomiting and diarrhea. Genitourinary: Negative for dysuria. Musculoskeletal: No myalgias or injuries. Skin: Negative for rash. Neurological: Negative for headaches   10-point ROS otherwise negative.  ____________________________________________   PHYSICAL EXAM:  VITAL SIGNS: ED Triage Vitals  Enc Vitals Group     BP --      Pulse --      Resp --      Temp --      Temp src --      SpO2 --      Weight --      Height --      Head Cir --      Peak Flow --      Pain Score --      Pain Loc --      Pain Edu? --      Excl. in [place]? --     Constitutional: Alert and oriented. Well  appearing and in no distress. ENT   Head: Normocephalic and atraumatic.   Nose: No congestion/rhinnorhea.   Mouth/Throat: Mucous membranes are moist. Cardiovascular: Normal rate, regular rhythm, no murmur noted Respiratory:  Normal respiratory effort, no tachypnea.    Breath sounds are overall clear and equal bilaterally.  Gastrointestinal: Soft and nontender. No distention.  Back: No muscle spasm, no tenderness, no CVA tenderness. Musculoskeletal: No deformity noted. Nontender with normal range of motion in all extremities.  No noted edema. Neurologic:  Normal speech and language. No gross focal neurologic deficits are appreciated.  Skin:  Skin is warm, dry. No rash noted. Psychiatric: Mood and affect are normal. Speech and behavior are normal. Pleasant and interactive. ____________________________________________    LABS (pertinent positives/negatives)  Labs Reviewed  CBC WITH DIFFERENTIAL/PLATELET - Abnormal; Notable for the following:    RDW 17.1 (*)    All other components within normal limits  BASIC METABOLIC PANEL - Abnormal; Notable for the following:    Chloride 100 (*)    Glucose, Bld 147 (*)    Calcium 8.7 (*)    All other components within normal limits  BRAIN NATRIURETIC PEPTIDE - Abnormal; Notable for the following:    B Natriuretic Peptide 315.0 (*)    All other components within normal limits  BLOOD GAS, ARTERIAL - Abnormal; Notable for the following:    pO2, Arterial 46 (*)    All other components within normal limits  TROPONIN I     ____________________________________________   EKG  ED ECG REPORT I, Ignace Mandigo W, the attending physician, personally viewed and interpreted this ECG.   Date: 06/23/2015  EKG Time: 2138  Rate: 88  Rhythm: Normal sinus rhythm with right bundle branch block  Axis: Normal  Intervals:QTC is extended at 508, QRS is prolonged at 170 with a right bundle branch block  ST&T Change:     ____________________________________________    RADIOLOGY  Chest x-ray IMPRESSION: Vascular congestion and borderline cardiomegaly, with diffusely increased interstitial markings, partially sparing the lung apices. This may reflect multifocal pneumonia or pulmonary edema, superimposed on the patient's known chronic interstitial  lung changes. ____________________________________________   INITIAL IMPRESSION / ASSESSMENT AND PLAN / ED COURSE  Pertinent labs & imaging results that were available during my care of the patient were reviewed by me and considered in my medical decision making (see chart for details).  Patient is alert and communicative but has a terrible oxygen saturation level at 77% with a good waveform on the monitor. We will obtain a blood gas to help [place] confirm this low sat reading given the fact that he looks moderately well otherwise. Chest x-ray pending.  ----------------------------------------- 10:53 PM on 06/23/2015 -----------------------------------------  Chest x-ray does show signs of vascular congestion with questions of multifocal pneumonia versus pulmonary edema.  The blood gas indeed does show a low O2 sat level at 83% on 5 L nasal cannula which is the patient's usual oxygen demand at home.  At this time, after the nebulized treatment, the patient is showing an oxygen saturation level of 87-88%, which is relatively good for him. I have offered him admission to the hospital, but he prefers to go home if possible. Given that he has his home oxygen and that this is not below the level he was told to seek care for, we will discharge him. I will place him on steroids as well as an antibiotic.   The family was in the room during this discussion. They agree with the plan. He is appreciative of the care. He'll follow-up with his regular doctor.  ____________________________________________   FINAL CLINICAL IMPRESSION(S) / ED DIAGNOSES  Final diagnoses:   COPD exacerbation  Chronic congestive heart failure, unspecified congestive heart failure type      Ahmed Prima, MD 06/23/15 2308

## 2015-06-23 NOTE — ED Notes (Signed)
Pt presents to ED with c/o shortness of breath. Pt reports chronic O2 use at home of 5L via [place]. Upon arrival to ED, pt's sats noted to be 70-75% on 5L. Dr Thomasene Lot made aware and at bedside at this time. Pt reports that he was instructed to report to the ED anytime his O2 sats drop below 82%. Pt is A&O, in NAD, speaking clearly and in complete sentences.

## 2015-06-24 LAB — BLOOD GAS, ARTERIAL
Acid-base deficit: 0.1 mmol/L (ref 0.0–2.0)
Bicarbonate: 23.9 mEq/L (ref 21.0–28.0)
FIO2: 0.4 %
O2 Saturation: 83 %
PCO2 ART: 36 mmHg (ref 32.0–48.0)
PO2 ART: 46 mmHg — AB (ref 83.0–108.0)
Patient temperature: 37
pH, Arterial: 7.43 (ref 7.350–7.450)

## 2015-07-27 ENCOUNTER — Emergency Department: Payer: Medicare Other

## 2015-07-27 ENCOUNTER — Other Ambulatory Visit: Payer: Self-pay

## 2015-07-27 ENCOUNTER — Emergency Department
Admission: EM | Admit: 2015-07-27 | Discharge: 2015-07-27 | Disposition: A | Discharge status: Home / Self Care | Payer: Medicare Other | Attending: Emergency Medicine | Admitting: Emergency Medicine

## 2015-07-27 ENCOUNTER — Encounter: Payer: Self-pay | Admitting: Emergency Medicine

## 2015-07-27 DIAGNOSIS — Z7901 Long term (current) use of anticoagulants: Secondary | ICD-10-CM | POA: Insufficient documentation

## 2015-07-27 DIAGNOSIS — J441 Chronic obstructive pulmonary disease with (acute) exacerbation: Secondary | ICD-10-CM | POA: Insufficient documentation

## 2015-07-27 DIAGNOSIS — Z87891 Personal history of nicotine dependence: Secondary | ICD-10-CM | POA: Insufficient documentation

## 2015-07-27 DIAGNOSIS — Z7982 Long term (current) use of aspirin: Secondary | ICD-10-CM | POA: Insufficient documentation

## 2015-07-27 DIAGNOSIS — I10 Essential (primary) hypertension: Secondary | ICD-10-CM | POA: Insufficient documentation

## 2015-07-27 DIAGNOSIS — R0602 Shortness of breath: Secondary | ICD-10-CM | POA: Diagnosis present

## 2015-07-27 DIAGNOSIS — Z79899 Other long term (current) drug therapy: Secondary | ICD-10-CM | POA: Insufficient documentation

## 2015-07-27 LAB — CBC WITH DIFFERENTIAL/PLATELET
BASOS PCT: 1 %
Basophils Absolute: 0.1 10*3/uL (ref 0–0.1)
EOS ABS: 0.4 10*3/uL (ref 0–0.7)
Eosinophils Relative: 6 %
HEMATOCRIT: 48.9 % (ref 40.0–52.0)
HEMOGLOBIN: 16.6 g/dL (ref 13.0–18.0)
LYMPHS PCT: 25 %
Lymphs Abs: 1.6 10*3/uL (ref 1.0–3.6)
MCH: 30 pg (ref 26.0–34.0)
MCHC: 34 g/dL (ref 32.0–36.0)
MCV: 88.1 fL (ref 80.0–100.0)
MONO ABS: 0.7 10*3/uL (ref 0.2–1.0)
Monocytes Relative: 11 %
NEUTROS PCT: 57 %
Neutro Abs: 3.6 10*3/uL (ref 1.4–6.5)
Platelets: 211 10*3/uL (ref 150–440)
RBC: 5.54 MIL/uL (ref 4.40–5.90)
RDW: 17.1 % — AB (ref 11.5–14.5)
WBC: 6.3 10*3/uL (ref 3.8–10.6)

## 2015-07-27 LAB — COMPREHENSIVE METABOLIC PANEL
ALT: 11 U/L — ABNORMAL LOW (ref 17–63)
AST: 20 U/L (ref 15–41)
Albumin: 4.4 g/dL (ref 3.5–5.0)
Alkaline Phosphatase: 103 U/L (ref 38–126)
Anion gap: 8 (ref 5–15)
BUN: 16 mg/dL (ref 6–20)
CALCIUM: 8.9 mg/dL (ref 8.9–10.3)
CO2: 27 mmol/L (ref 22–32)
Chloride: 100 mmol/L — ABNORMAL LOW (ref 101–111)
Creatinine, Ser: 1.25 mg/dL — ABNORMAL HIGH (ref 0.61–1.24)
GFR calc Af Amer: 59 mL/min — ABNORMAL LOW (ref >60)
GFR, EST NON AFRICAN AMERICAN: 51 mL/min — AB (ref >60)
Glucose, Bld: 157 mg/dL — ABNORMAL HIGH (ref 65–99)
Potassium: 4.1 mmol/L (ref 3.5–5.1)
SODIUM: 135 mmol/L (ref 135–145)
Total Bilirubin: 1 mg/dL (ref 0.3–1.2)
Total Protein: 7.8 g/dL (ref 6.5–8.1)

## 2015-07-27 LAB — TROPONIN I: Troponin I: <0.03 ng/mL (ref <0.031)

## 2015-07-27 LAB — BRAIN NATRIURETIC PEPTIDE: B Natriuretic Peptide: 352 pg/mL — ABNORMAL HIGH (ref 0.0–100.0)

## 2015-07-27 MED ORDER — IPRATROPIUM-ALBUTEROL 0.5-2.5 (3) MG/3ML IN SOLN
9.0000 mL | Freq: Once | RESPIRATORY_TRACT | Status: AC
  Administered 2015-07-27: 9 mL via RESPIRATORY_TRACT
  Filled 2015-07-27: qty 9

## 2015-07-27 MED ORDER — AZITHROMYCIN 250 MG PO TABS
ORAL_TABLET | ORAL | Status: DC
Start: 2015-07-27 — End: 2015-10-08

## 2015-07-27 MED ORDER — FUROSEMIDE 10 MG/ML IJ SOLN
40.0000 mg | Freq: Once | INTRAMUSCULAR | Status: AC
  Administered 2015-07-27: 40 mg via INTRAVENOUS
  Filled 2015-07-27: qty 4

## 2015-07-27 MED ORDER — METHYLPREDNISOLONE SODIUM SUCC 125 MG IJ SOLR
125.0000 mg | Freq: Once | INTRAMUSCULAR | Status: AC
  Administered 2015-07-27: 125 mg via INTRAVENOUS
  Filled 2015-07-27: qty 2

## 2015-07-27 MED ORDER — AZITHROMYCIN 250 MG PO TABS
500.0000 mg | ORAL_TABLET | Freq: Once | ORAL | Status: AC
  Administered 2015-07-27: 500 mg via ORAL
  Filled 2015-07-27: qty 2

## 2015-07-27 MED ORDER — PREDNISONE 20 MG PO TABS: 60.0000 mg | ORAL_TABLET | Freq: Every day | ORAL | Status: DC

## 2015-07-27 NOTE — Discharge Instructions (Signed)
Chronic Obstructive Pulmonary Disease Exacerbation ° Chronic obstructive pulmonary disease (COPD) is a common lung problem. In COPD, the flow of air from the lungs is limited. COPD exacerbations are times that breathing gets worse and you need extra treatment. Without treatment they can be life threatening. If they happen often, your lungs can become more damaged. °HOME CARE °· Do not smoke. °· Avoid tobacco smoke and other things that bother your lungs. °· If given, take your antibiotic medicine as told. Finish the medicine even if you start to feel better. °· Only take medicines as told by your doctor. °· Drink enough fluids to keep your pee (urine) clear or pale yellow (unless your doctor has told you not to). °· Use a cool mist machine (vaporizer). °· If you use oxygen or a machine that turns liquid medicine into a mist (nebulizer), continue to use them as told. °· Keep up with shots (vaccinations) as told by your doctor. °· Exercise regularly. °· Eat healthy foods. °· Keep all doctor visits as told. °GET HELP RIGHT AWAY IF: °· You are very short of breath and it gets worse. °· You have trouble talking. °· You have bad chest pain. °· You have blood in your spit (sputum). °· You have a fever. °· You keep throwing up (vomiting). °· You feel weak, or you pass out (faint). °· You feel confused. °· You keep getting worse. °MAKE SURE YOU:  °· Understand these instructions. °· Will watch your condition. °· Will get help right away if you are not doing well or get worse. °Document Released: 11/25/2011 Document Revised: 09/26/2013 Document Reviewed: 08/10/2013 °[employer]® Patient Information ©2015 [employer]. This information is not intended to replace advice given to you by your health care provider. Make sure you discuss any questions you have with your health care provider. ° °

## 2015-07-27 NOTE — ED Notes (Signed)
Pt says he checks his oxygen regularly-wears home O2 at 5.5L and keeps sats over 83%; anything less he needs to come to the ED; today his reading was in the low 70's; pt denies increased shortness of breath; denies cough; denies fever;

## 2015-07-27 NOTE — ED Provider Notes (Signed)
[facility] Emergency Department Provider Note  ____________________________________________  Time seen: Seen upon arrival to the emergency department  I have reviewed the triage vital signs and the nursing notes.   HISTORY  Chief Complaint Shortness of Breath    HPI Brett Burton is a 81 y.o. male with a history of advanced COPD on 5 L nasal cannula O2 at home presents today with shortness of breath with cough and hypoxia. He said that his pulmonologist is okay with him being anywhere above 83% on his baseline oxygen at home. However, today the patient found himself down in the 70s at home. On arrival to the emergency department here he was in the mid 91s on his oxygen. He denies any pain. Denies any productive cough. No known sick contacts. Compliant with his medications including his Advair. Tried several breathing treatments prior to arrival which she says did not help.Former smoker.   Past Medical History  Diagnosis Date  . COPD (chronic obstructive pulmonary disease)   . Hypertension   . Melanoma     Patient Active Problem List   Diagnosis Date Noted  . Acute diastolic CHF (congestive heart failure) 04/24/2015  . COPD exacerbation 04/24/2015  . Hyponatremia 04/24/2015  . Acute on chronic respiratory failure with hypoxemia 04/19/2015    Past Surgical History  Procedure Laterality Date  . Colon surgery    . Tonsillectomy    . Appendectomy      Current Outpatient Rx  Name  Route  Sig  Dispense  Refill  . albuterol-ipratropium (COMBIVENT) 18-103 MCG/ACT inhaler   Inhalation   Inhale 1 puff into the lungs once. Once a day for Shortness of breath as needed         . amLODipine (NORVASC) 10 MG tablet   Oral   Take 10 mg by mouth daily.         Marland Kitchen aspirin 81 MG tablet   Oral   Take 81 mg by mouth daily.         Marland Kitchen atorvastatin (LIPITOR) 40 MG tablet   Oral   Take 40 mg by mouth daily.         . carvedilol (COREG) 3.125 MG  tablet   Oral   Take 3.125 mg by mouth 2 (two) times daily with a meal.         . clopidogrel (PLAVIX) 75 MG tablet   Oral   Take 75 mg by mouth daily.         . Fluticasone-Salmeterol (ADVAIR) 500-50 MCG/DOSE AEPB   Inhalation   Inhale into the lungs 2 (two) times daily.         . furosemide (LASIX) 20 MG tablet   Oral   Take 1 tablet (20 mg total) by mouth daily.   30 tablet   0   . levofloxacin (LEVAQUIN) 500 MG tablet   Oral   Take 1 tablet (500 mg total) by mouth daily at 6 PM.   6 tablet   0   . methylPREDNISolone (MEDROL DOSEPAK) 4 MG TBPK tablet      follow package directions   21 tablet   0   . predniSONE (DELTASONE) 20 MG tablet      Take 2 tablets the first day, then take one tablet a day for 4 more days.   6 tablet   0   . tiotropium (SPIRIVA) 18 MCG inhalation capsule   Inhalation   Place 18 mcg into inhaler and inhale daily. 1  cap(s) inhaled once a day           Allergies Review of patient's allergies indicates no known allergies.  History reviewed. No pertinent family history.  Social History History  Substance Use Topics  . Smoking status: Former Research scientist (life sciences)  . Smokeless tobacco: Not on file  . Alcohol Use: No    Review of Systems Constitutional: No fever/chills Eyes: No visual changes. ENT: No sore throat. Cardiovascular: Denies chest pain. Respiratory: Acute shortness of breath with cough  Gastrointestinal: No abdominal pain.  No nausea, no vomiting.  No diarrhea.  No constipation. Genitourinary: Negative for dysuria. Musculoskeletal: Negative for back pain. Skin: Negative for rash. Neurological: Negative for headaches, focal weakness or numbness.  10-point ROS otherwise negative.  ____________________________________________   PHYSICAL EXAM:  VITAL SIGNS: ED Triage Vitals  Enc Vitals Group     BP 07/27/15 1925 147/71 mmHg     Pulse Rate 07/27/15 1925 82     Resp 07/27/15 1925 25     Temp 07/27/15 1925 97.9 F (36.6  C)     Temp Source 07/27/15 1925 Oral     SpO2 07/27/15 1925 76 %     Weight 07/27/15 1925 176 lb (79.833 kg)     Height 07/27/15 1925 5\' 10"  (1.778 m)     Head Cir --      Peak Flow --      Pain Score 07/27/15 1926 0     Pain Loc --      Pain Edu? --      Excl. in [place]? --     Constitutional: Alert and oriented. Well appearing and in no acute distress. Eyes: Conjunctivae are normal. PERRL. EOMI. Head: Atraumatic. Nose: No congestion/rhinnorhea. Mouth/Throat: Mucous membranes are moist.  Oropharynx non-erythematous. Neck: No stridor.   Cardiovascular: Normal rate, regular rhythm. Grossly normal heart sounds.  Good peripheral circulation. Respiratory: Normal respiratory effort.  No retractions. Moderate air movement throughout with wheezes which are worse at the bases. Wearing his nasal cannula oxygen. Gastrointestinal: Soft and nontender. No distention. No abdominal bruits. No CVA tenderness. Musculoskeletal: No lower extremity tenderness nor edema.  No joint effusions. Neurologic:  Normal speech and language. No gross focal neurologic deficits are appreciated. No gait instability. Skin:  Skin is warm, dry and intact. No rash noted. Psychiatric: Mood and affect are normal. Speech and behavior are normal.  ____________________________________________   LABS (all labs ordered are listed, but only abnormal results are displayed)  Labs Reviewed  CBC WITH DIFFERENTIAL/PLATELET - Abnormal; Notable for the following:    RDW 17.1 (*)    All other components within normal limits  COMPREHENSIVE METABOLIC PANEL - Abnormal; Notable for the following:    Chloride 100 (*)    Glucose, Bld 157 (*)    Creatinine, Ser 1.25 (*)    ALT 11 (*)    GFR calc non Af Amer 51 (*)    GFR calc Af Amer 59 (*)    All other components within normal limits  BRAIN NATRIURETIC PEPTIDE - Abnormal; Notable for the following:    B Natriuretic Peptide 352.0 (*)    All other components within normal limits   TROPONIN I   ____________________________________________  EKG  ED ECG REPORT I, Doran Stabler, the attending physician, personally viewed and interpreted this ECG.   Date: 07/27/2015  EKG Time: 1925  Rate: 86  Rhythm: normal sinus rhythm  Axis: Normal axis  Intervals:right bundle branch block  ST&T Change: T wave inversions  in 3, aVF and V3 as well as biphasic T-wave in V2 No change from EKG from 06/23/2015 ____________________________________________  RADIOLOGY  Chest x-ray with cardiomegaly. Grossly unchanged coarse interstitial pulmonary passages. Infection not excluded. I personally reviewed these images. ____________________________________________   PROCEDURES    ____________________________________________   INITIAL IMPRESSION / ASSESSMENT AND PLAN / ED COURSE  Pertinent labs & imaging results that were available during my care of the patient were reviewed by me and considered in my medical decision making (see chart for details).  ----------------------------------------- 9:24 PM on 07/27/2015 -----------------------------------------  Patient feeling improved at this time after breathing treatments. When at rest is anywhere from 85% on his baseline 5 L nasal cannula up to 96%. With ambulation and standing he ranges from 84% to 92%. Patient says he feels improved and back to baseline. We'll discharge to home with antibiotics as well as steroid burst. We'll give one time dose of Lasix because of edema on the chest x-ray. Patient and family know to return if symptoms worsen. ____________________________________________   FINAL CLINICAL IMPRESSION(S) / ED DIAGNOSES  Acute COPD exacerbation. Initial visit.    Orbie Pyo, MD 07/27/15 2125

## 2015-10-07 ENCOUNTER — Inpatient Hospital Stay
Admission: EM | Admit: 2015-10-07 | Discharge: 2015-10-08 | DRG: 190 | Disposition: A | Discharge status: Home Health | Payer: Medicare Other | Attending: Internal Medicine | Admitting: Internal Medicine

## 2015-10-07 ENCOUNTER — Emergency Department: Payer: Medicare Other

## 2015-10-07 DIAGNOSIS — Z9981 Dependence on supplemental oxygen: Secondary | ICD-10-CM

## 2015-10-07 DIAGNOSIS — I482 Chronic atrial fibrillation: Secondary | ICD-10-CM | POA: Diagnosis present

## 2015-10-07 DIAGNOSIS — R0902 Hypoxemia: Secondary | ICD-10-CM

## 2015-10-07 DIAGNOSIS — I10 Essential (primary) hypertension: Secondary | ICD-10-CM | POA: Diagnosis present

## 2015-10-07 DIAGNOSIS — J441 Chronic obstructive pulmonary disease with (acute) exacerbation: Principal | ICD-10-CM | POA: Diagnosis present

## 2015-10-07 DIAGNOSIS — Z85820 Personal history of malignant melanoma of skin: Secondary | ICD-10-CM | POA: Diagnosis not present

## 2015-10-07 DIAGNOSIS — R0602 Shortness of breath: Secondary | ICD-10-CM

## 2015-10-07 DIAGNOSIS — Z79899 Other long term (current) drug therapy: Secondary | ICD-10-CM | POA: Diagnosis not present

## 2015-10-07 DIAGNOSIS — Z7982 Long term (current) use of aspirin: Secondary | ICD-10-CM

## 2015-10-07 DIAGNOSIS — R059 Cough, unspecified: Secondary | ICD-10-CM

## 2015-10-07 DIAGNOSIS — Z87891 Personal history of nicotine dependence: Secondary | ICD-10-CM

## 2015-10-07 DIAGNOSIS — E785 Hyperlipidemia, unspecified: Secondary | ICD-10-CM | POA: Diagnosis present

## 2015-10-07 DIAGNOSIS — J9621 Acute and chronic respiratory failure with hypoxia: Secondary | ICD-10-CM | POA: Diagnosis present

## 2015-10-07 DIAGNOSIS — J9620 Acute and chronic respiratory failure, unspecified whether with hypoxia or hypercapnia: Secondary | ICD-10-CM | POA: Diagnosis present

## 2015-10-07 DIAGNOSIS — I2510 Atherosclerotic heart disease of native coronary artery without angina pectoris: Secondary | ICD-10-CM | POA: Diagnosis present

## 2015-10-07 DIAGNOSIS — Z7902 Long term (current) use of antithrombotics/antiplatelets: Secondary | ICD-10-CM

## 2015-10-07 DIAGNOSIS — Z955 Presence of coronary angioplasty implant and graft: Secondary | ICD-10-CM | POA: Diagnosis not present

## 2015-10-07 DIAGNOSIS — R05 Cough: Secondary | ICD-10-CM

## 2015-10-07 DIAGNOSIS — J8410 Pulmonary fibrosis, unspecified: Secondary | ICD-10-CM | POA: Diagnosis present

## 2015-10-07 LAB — BASIC METABOLIC PANEL
Anion gap: 9 (ref 5–15)
BUN: 17 mg/dL (ref 6–20)
CO2: 26 mmol/L (ref 22–32)
CREATININE: 1.19 mg/dL (ref 0.61–1.24)
Calcium: 9 mg/dL (ref 8.9–10.3)
Chloride: 101 mmol/L (ref 101–111)
GFR calc Af Amer: >60 mL/min (ref >60)
GFR calc non Af Amer: 54 mL/min — ABNORMAL LOW (ref >60)
Glucose, Bld: 135 mg/dL — ABNORMAL HIGH (ref 65–99)
Potassium: 4.2 mmol/L (ref 3.5–5.1)
SODIUM: 136 mmol/L (ref 135–145)

## 2015-10-07 LAB — BLOOD GAS, VENOUS
PH VEN: 7.39 (ref 7.320–7.430)
Patient temperature: 37
pCO2, Ven: 44 mmHg (ref 44.0–60.0)

## 2015-10-07 LAB — CBC
HCT: 47.5 % (ref 40.0–52.0)
HEMOGLOBIN: 15.4 g/dL (ref 13.0–18.0)
MCH: 26.5 pg (ref 26.0–34.0)
MCHC: 32.4 g/dL (ref 32.0–36.0)
MCV: 81.9 fL (ref 80.0–100.0)
Platelets: 208 10*3/uL (ref 150–440)
RBC: 5.8 MIL/uL (ref 4.40–5.90)
RDW: 19.3 % — ABNORMAL HIGH (ref 11.5–14.5)
WBC: 9.1 10*3/uL (ref 3.8–10.6)

## 2015-10-07 LAB — BRAIN NATRIURETIC PEPTIDE: B Natriuretic Peptide: 559 pg/mL — ABNORMAL HIGH (ref 0.0–100.0)

## 2015-10-07 LAB — TROPONIN I

## 2015-10-07 MED ORDER — ATORVASTATIN CALCIUM 20 MG PO TABS
40.0000 mg | ORAL_TABLET | Freq: Every day | ORAL | Status: DC
  Administered 2015-10-07: 40 mg via ORAL
  Filled 2015-10-07: qty 2

## 2015-10-07 MED ORDER — ASPIRIN EC 81 MG PO TBEC
81.0000 mg | DELAYED_RELEASE_TABLET | Freq: Every day | ORAL | Status: DC
  Administered 2015-10-07: 81 mg via ORAL
  Filled 2015-10-07 (×2): qty 1

## 2015-10-07 MED ORDER — METHYLPREDNISOLONE SODIUM SUCC 125 MG IJ SOLR
125.0000 mg | Freq: Once | INTRAMUSCULAR | Status: AC
  Administered 2015-10-07: 125 mg via INTRAVENOUS
  Filled 2015-10-07: qty 2

## 2015-10-07 MED ORDER — ONDANSETRON HCL 4 MG/2ML IJ SOLN: 4.0000 mg | Freq: Four times a day (QID) | INTRAMUSCULAR | Status: DC | PRN

## 2015-10-07 MED ORDER — ENOXAPARIN SODIUM 40 MG/0.4ML ~~LOC~~ SOLN
40.0000 mg | SUBCUTANEOUS | Status: DC
  Administered 2015-10-07: 40 mg via SUBCUTANEOUS
  Filled 2015-10-07: qty 0.4

## 2015-10-07 MED ORDER — SODIUM CHLORIDE 0.9 % IJ SOLN
3.0000 mL | Freq: Two times a day (BID) | INTRAMUSCULAR | Status: DC
  Administered 2015-10-07 – 2015-10-08 (×2): 3 mL via INTRAVENOUS

## 2015-10-07 MED ORDER — INFLUENZA VAC SPLIT QUAD 0.5 ML IM SUSY
0.5000 mL | PREFILLED_SYRINGE | INTRAMUSCULAR | Status: AC
  Administered 2015-10-08: 0.5 mL via INTRAMUSCULAR
  Filled 2015-10-07: qty 0.5

## 2015-10-07 MED ORDER — IPRATROPIUM-ALBUTEROL 0.5-2.5 (3) MG/3ML IN SOLN
3.0000 mL | RESPIRATORY_TRACT | Status: DC
  Administered 2015-10-07 – 2015-10-08 (×5): 3 mL via RESPIRATORY_TRACT
  Filled 2015-10-07 (×5): qty 3

## 2015-10-07 MED ORDER — IPRATROPIUM-ALBUTEROL 0.5-2.5 (3) MG/3ML IN SOLN: 3.0000 mL | Freq: Four times a day (QID) | RESPIRATORY_TRACT | Status: DC | PRN

## 2015-10-07 MED ORDER — ISOSORBIDE MONONITRATE ER 30 MG PO TB24
30.0000 mg | ORAL_TABLET | Freq: Every day | ORAL | Status: DC
Start: 2015-10-08 — End: 2015-10-08
  Administered 2015-10-08: 30 mg via ORAL
  Filled 2015-10-07: qty 1

## 2015-10-07 MED ORDER — DILTIAZEM HCL ER 120 MG PO CP24
120.0000 mg | ORAL_CAPSULE | Freq: Every day | ORAL | Status: DC
  Administered 2015-10-08: 120 mg via ORAL
  Filled 2015-10-07 (×2): qty 1

## 2015-10-07 MED ORDER — TIOTROPIUM BROMIDE MONOHYDRATE 18 MCG IN CAPS: 18.0000 ug | ORAL_CAPSULE | Freq: Every day | RESPIRATORY_TRACT | Status: DC

## 2015-10-07 MED ORDER — ALUM & MAG HYDROXIDE-SIMETH 200-200-20 MG/5ML PO SUSP: 30.0000 mL | Freq: Four times a day (QID) | ORAL | Status: DC | PRN

## 2015-10-07 MED ORDER — SODIUM CHLORIDE 0.9 % IJ SOLN: 3.0000 mL | INTRAMUSCULAR | Status: DC | PRN

## 2015-10-07 MED ORDER — ACETAMINOPHEN 325 MG PO TABS: 650.0000 mg | ORAL_TABLET | Freq: Four times a day (QID) | ORAL | Status: DC | PRN

## 2015-10-07 MED ORDER — TIOTROPIUM BROMIDE MONOHYDRATE 18 MCG IN CAPS
18.0000 ug | ORAL_CAPSULE | Freq: Every day | RESPIRATORY_TRACT | Status: DC
  Filled 2015-10-07: qty 5

## 2015-10-07 MED ORDER — LEVOFLOXACIN IN D5W 750 MG/150ML IV SOLN
750.0000 mg | Freq: Once | INTRAVENOUS | Status: AC
  Administered 2015-10-07: 750 mg via INTRAVENOUS
  Filled 2015-10-07: qty 150

## 2015-10-07 MED ORDER — ONDANSETRON HCL 4 MG PO TABS: 4.0000 mg | ORAL_TABLET | Freq: Four times a day (QID) | ORAL | Status: DC | PRN

## 2015-10-07 MED ORDER — IPRATROPIUM-ALBUTEROL 20-100 MCG/ACT IN AERS: 1.0000 | INHALATION_SPRAY | Freq: Four times a day (QID) | RESPIRATORY_TRACT | Status: DC | PRN

## 2015-10-07 MED ORDER — CLOPIDOGREL BISULFATE 75 MG PO TABS
75.0000 mg | ORAL_TABLET | Freq: Every day | ORAL | Status: DC
  Administered 2015-10-07: 75 mg via ORAL
  Filled 2015-10-07: qty 1

## 2015-10-07 MED ORDER — ACETAMINOPHEN 650 MG RE SUPP: 650.0000 mg | Freq: Four times a day (QID) | RECTAL | Status: DC | PRN

## 2015-10-07 MED ORDER — METHYLPREDNISOLONE SODIUM SUCC 40 MG IJ SOLR
40.0000 mg | Freq: Three times a day (TID) | INTRAMUSCULAR | Status: DC
  Administered 2015-10-07 – 2015-10-08 (×3): 40 mg via INTRAVENOUS
  Filled 2015-10-07 (×3): qty 1

## 2015-10-07 MED ORDER — MOMETASONE FURO-FORMOTEROL FUM 100-5 MCG/ACT IN AERO
2.0000 | INHALATION_SPRAY | Freq: Two times a day (BID) | RESPIRATORY_TRACT | Status: DC
  Administered 2015-10-07 – 2015-10-08 (×2): 2 via RESPIRATORY_TRACT
  Filled 2015-10-07: qty 8.8

## 2015-10-07 MED ORDER — SODIUM CHLORIDE 0.9 % IV SOLN: 250.0000 mL | INTRAVENOUS | Status: DC | PRN

## 2015-10-07 MED ORDER — METOPROLOL SUCCINATE ER 25 MG PO TB24
25.0000 mg | ORAL_TABLET | Freq: Every day | ORAL | Status: DC
  Administered 2015-10-08: 25 mg via ORAL
  Filled 2015-10-07: qty 1

## 2015-10-07 NOTE — ED Notes (Signed)
Patient transported to X-ray 

## 2015-10-07 NOTE — H&P (Signed)
[facility] [place] NAME: Brett Burton    MR#:  [id number]  DATE OF BIRTH:  [birth year]  DATE OF ADMISSION:  10/07/2015  PRIMARY CARE PHYSICIAN: Sofie Hartigan, MD   REQUESTING/REFERRING PHYSICIAN: Dr. Mariea Clonts  CHIEF COMPLAINT:   Shortness of breath and oxygen level down to 79% HISTORY OF PRESENT ILLNESS:  Berk Pilot  is a 81 y.o. male with a known history of end-stage COPD on 5 L nasal cannula oxygen hose normal sats at home stays around 86-88% comes to the emergency room after he noted his sats dropped down into the upper 70s today. He was placed on BiPAP. Patient during my evaluation is feeling better his sats are 94%. He is requesting to Come off BiPAP. Denies any fever or any productive cough.  PAST MEDICAL HISTORY:   Past Medical History  Diagnosis Date  . COPD (chronic obstructive pulmonary disease)   . Hypertension   . Melanoma     PAST SURGICAL HISTOIRY:   Past Surgical History  Procedure Laterality Date  . Colon surgery    . Tonsillectomy    . Appendectomy      SOCIAL HISTORY:   Social History  Substance Use Topics  . Smoking status: Former Research scientist (life sciences)  . Smokeless tobacco: Not on file  . Alcohol Use: No    FAMILY HISTORY:  No family history on file.  DRUG ALLERGIES:  No Known Allergies  REVIEW OF SYSTEMS:  Review of Systems  Constitutional: Negative for fever, chills and weight loss.  HENT: Negative for ear discharge, ear pain and nosebleeds.   Eyes: Negative for blurred vision, pain and discharge.  Respiratory: Positive for shortness of breath and wheezing. Negative for sputum production and stridor.   Cardiovascular: Negative for chest pain, palpitations, orthopnea and PND.  Gastrointestinal: Negative for nausea, vomiting, abdominal pain and diarrhea.  Genitourinary: Negative for urgency and frequency.  Musculoskeletal: Negative for back pain and joint pain.  Neurological: Positive for weakness.  Negative for sensory change, speech change and focal weakness.  Psychiatric/Behavioral: Negative for depression and hallucinations. The patient is not nervous/anxious.      MEDICATIONS AT HOME:   Prior to Admission medications   Medication Sig Start Date End Date Taking? Authorizing Provider  aspirin EC 81 MG tablet Take 81 mg by mouth at bedtime.   Yes Historical Provider, MD  atorvastatin (LIPITOR) 40 MG tablet Take 40 mg by mouth at bedtime.    Yes Historical Provider, MD  clopidogrel (PLAVIX) 75 MG tablet Take 75 mg by mouth at bedtime.    Yes Historical Provider, MD  diltiazem (DILACOR XR) 120 MG 24 hr capsule Take 120 mg by mouth daily.   Yes Historical Provider, MD  Fluticasone-Salmeterol (ADVAIR) 250-50 MCG/DOSE AEPB Inhale 1 puff into the lungs 2 (two) times daily.   Yes Historical Provider, MD  Ipratropium-Albuterol (COMBIVENT RESPIMAT) 20-100 MCG/ACT AERS respimat Inhale 1 puff into the lungs every 6 (six) hours as needed for wheezing or shortness of breath.   Yes Historical Provider, MD  isosorbide mononitrate (IMDUR) 30 MG 24 hr tablet Take 30 mg by mouth daily.   Yes Historical Provider, MD  metoprolol succinate (TOPROL-XL) 25 MG 24 hr tablet Take 25 mg by mouth daily.   Yes Historical Provider, MD  tiotropium (SPIRIVA) 18 MCG inhalation capsule Place 18 mcg into inhaler and inhale daily at 12 noon.    Yes Historical Provider, MD  azithromycin (ZITHROMAX) 250 MG tablet Take 1  tab PO Daily Patient not taking: Reported on 10/07/2015 07/27/15   Orbie Pyo, MD  furosemide (LASIX) 20 MG tablet Take 1 tablet (20 mg total) by mouth daily. Patient not taking: Reported on 10/07/2015 04/24/15   Theodoro Grist, MD  levofloxacin (LEVAQUIN) 500 MG tablet Take 1 tablet (500 mg total) by mouth daily at 6 PM. Patient not taking: Reported on 10/07/2015 04/24/15   Theodoro Grist, MD  methylPREDNISolone (MEDROL DOSEPAK) 4 MG TBPK tablet follow package directions Patient not taking: Reported  on 10/07/2015 04/24/15   Theodoro Grist, MD  predniSONE (DELTASONE) 20 MG tablet Take 3 tablets (60 mg total) by mouth daily. Patient not taking: Reported on 10/07/2015 07/27/15 07/26/16  Orbie Pyo, MD      VITAL SIGNS:  Blood pressure 131/74, pulse 83, temperature 97.9 F (36.6 C), temperature source Oral, resp. rate 24, SpO2 79 %.  PHYSICAL EXAMINATION:  GENERAL:  81 y.o.-year-old patient lying in the bed with no acute distress. Appears chronically ill EYES: Pupils equal, round, reactive to light and accommodation. No scleral icterus. Extraocular muscles intact.  HEENT: Head atraumatic, normocephalic. Oropharynx and nasopharynx clear.  NECK:  Supple, no jugular venous distention. No thyroid enlargement, no tenderness.  LUNGS: Distant breath sounds bilaterally, no wheezing, rales,rhonchi or crepitation. No use of accessory muscles of respiration. Scattered rhonchi CARDIOVASCULAR: S1, S2 normal. No murmurs, rubs, or gallops.  ABDOMEN: Soft, nontender, nondistended. Bowel sounds present. No organomegaly or mass.  EXTREMITIES: No pedal edema, cyanosis, or clubbing.  NEUROLOGIC: Cranial nerves II through XII are intact. Muscle strength 5/5 in all extremities. Sensation intact. Gait not checked.  PSYCHIATRIC: The patient is alert and oriented x 3.  SKIN: No obvious rash, lesion, or ulcer.   LABORATORY PANEL:   CBC  Recent Labs Lab 10/07/15 1349  WBC 9.1  HGB 15.4  HCT 47.5  PLT 208   ------------------------------------------------------------------------------------------------------------------  Chemistries   Recent Labs Lab 10/07/15 1349  NA 136  K 4.2  CL 101  CO2 26  GLUCOSE 135*  BUN 17  CREATININE 1.19  CALCIUM 9.0   ------------------------------------------------------------------------------------------------------------------  Cardiac Enzymes  Recent Labs Lab 10/07/15 1349  TROPONINI <0.03    ------------------------------------------------------------------------------------------------------------------  RADIOLOGY:  Dg Chest 2 View  10/07/2015  CLINICAL DATA:  Shortness of breath for past week, productive cough, COPD, hypertension, history melanoma EXAM: CHEST  2 VIEW COMPARISON:  07/27/2015 FINDINGS: Enlargement of cardiac silhouette. Calcification and mild tortuosity of thoracic aorta. Pulmonary vascularity normal. Emphysematous changes with chronic diffuse interstitial infiltrates bilaterally. These could represent pulmonary fibrosis, chronic interstitial lung disease, stability since previous exam making recurrent edema or infection less likely. No segmental consolidation, pleural effusion or pneumothorax. Diffuse osseous demineralization. IMPRESSION: Enlargement of cardiac silhouette. COPD changes with chronic BILATERAL interstitial infiltrates favor pulmonary fibrosis or chronic interstitial lung disease over recurrent edema and infection. Electronically Signed   By: Lavonia Dana M.D.   On: 10/07/2015 14:24    EKG:   Normal sinus rhythm. RBBB. LBBB IMPRESSION AND PLAN:    81 year old male with history of chronic obstructive pulmonary disease on chronic home O2, history of coronary artery disease status post stent placement, hyperlipidemia, who presented to the hospital due to shortness of breath, hypoxia from home with O2 saturations in the 70s.   1. Acute on chronic respiratory failure with hypoxia. This is likely due to chronic obstructive pulmonary disease exacerbation  -Chest x-ray shows chronic COPD changes along with pulmonary fibrosis. No evidence of pneumonia. Patient clinically does not seem  to have pneumonia. He did receive IV Levaquin in the emergency room. I will hold off on antibiotics at present since his vitals are stable has normal white count and no evidence of any URI symptoms - treat his underlying COPD with IV steroids, around-the-clock nebulizer  treatments, continue his Dulera and Spiriva, and follow him clinically.  -Taper steroids when clinically indicated -Patient currently is on BiPAP requested to come off full put him back on his 5 L nasal cannula oxygen. -A consultation as needed  2. Chronic obstructive pulmonary disease exacerbation. As mentioned we will give him IV steroids, around-the-clock nebulizer treatments, continue his Dulera and Spiriva.   3. History of coronary artery disease status post stent placement. The patient has no acute chest pain. - will continue his aspirin, Plavix, statin, and beta blocker.   4. Hyperlipidemia. Continue atorvastatin.   5. History of chronic atrial fibrillation. The patient is rate controlled and is in normal sinus rhythm. Continue his diltiazem and metoprolol  All the records are reviewed and case discussed with ED provider. Management plans discussed with the patient, family (wife) and they are in agreement.  CODE STATUS: Full  TOTAL TIME TAKING CARE OF THIS PATIENT: 50 minutes.    Halima Fogal M.D on 10/07/2015 at 3:33 PM  Between 7am to 6pm - Pager - [phone]  After 6pm go to [email or web address] - password [email or web address] [facility]  [facility]  Office  [phone]  CC: Primary care physician; [facility] Chrissie Noa, MD

## 2015-10-07 NOTE — ED Provider Notes (Signed)
[facility] Emergency Department Provider Note  ____________________________________________  Time seen: Approximately 1:42 PM  I have reviewed the triage vital signs and the nursing notes.   HISTORY  Chief Complaint Shortness of Breath    HPI Brett Burton is a 81 y.o. male with a history of severe COPD on 5 L nasal cannula baseline, hypertension, presenting with 1 week of progressively worsening shortness of breath. Patient reports that over the past week he has had increased shortness of breath, decreased exercise tolerance, and an increase in his nonproductive cough. He denies any fevers or chills, chest pain, palpitations, or syncope. He denies any lower extremity swelling or calf pain.   Past Medical History  Diagnosis Date  . COPD (chronic obstructive pulmonary disease)   . Hypertension   . Melanoma     Patient Active Problem List   Diagnosis Date Noted  . Acute diastolic CHF (congestive heart failure) ([place]) 04/24/2015  . COPD exacerbation ([place]) 04/24/2015  . Hyponatremia 04/24/2015  . Acute on chronic respiratory failure with hypoxemia ([place]) 04/19/2015    Past Surgical History  Procedure Laterality Date  . Colon surgery    . Tonsillectomy    . Appendectomy      Current Outpatient Rx  Name  Route  Sig  Dispense  Refill  . albuterol-ipratropium (COMBIVENT) 18-103 MCG/ACT inhaler   Inhalation   Inhale 1 puff into the lungs once. Once a day for Shortness of breath as needed         . amLODipine (NORVASC) 10 MG tablet   Oral   Take 10 mg by mouth daily.         Marland Kitchen aspirin 81 MG tablet   Oral   Take 81 mg by mouth daily.         Marland Kitchen atorvastatin (LIPITOR) 40 MG tablet   Oral   Take 40 mg by mouth daily.         Marland Kitchen azithromycin (ZITHROMAX) 250 MG tablet      Take 1 tab PO Daily   4 each   0   . carvedilol (COREG) 3.125 MG tablet   Oral   Take 3.125 mg by mouth 2 (two) times daily with a meal.         . clopidogrel  (PLAVIX) 75 MG tablet   Oral   Take 75 mg by mouth daily.         . Fluticasone-Salmeterol (ADVAIR) 500-50 MCG/DOSE AEPB   Inhalation   Inhale into the lungs 2 (two) times daily.         . furosemide (LASIX) 20 MG tablet   Oral   Take 1 tablet (20 mg total) by mouth daily.   30 tablet   0   . levofloxacin (LEVAQUIN) 500 MG tablet   Oral   Take 1 tablet (500 mg total) by mouth daily at 6 PM.   6 tablet   0   . methylPREDNISolone (MEDROL DOSEPAK) 4 MG TBPK tablet      follow package directions   21 tablet   0   . predniSONE (DELTASONE) 20 MG tablet   Oral   Take 3 tablets (60 mg total) by mouth daily.   15 tablet   0   . tiotropium (SPIRIVA) 18 MCG inhalation capsule   Inhalation   Place 18 mcg into inhaler and inhale daily. 1 cap(s) inhaled once a day           Allergies Review of patient's allergies  indicates no known allergies.  No family history on file.  Social History Social History  Substance Use Topics  . Smoking status: Former Research scientist (life sciences)  . Smokeless tobacco: Not on file  . Alcohol Use: No    Review of Systems Constitutional: No fever/chills. no lightheadedness area and no syncope. Eyes: No visual changes. ENT: No sore throat. Cardiovascular: Denies chest pain, palpitations. Respiratory: + shortness of breath.  Nonproductive cough. Gastrointestinal: No abdominal pain.  No nausea, no vomiting.  No diarrhea.  No constipation. Genitourinary: Negative for dysuria. Musculoskeletal: Negative for back pain. Negative for lower extremity edema. Skin: Negative for rash. Neurological: Negative for headaches, focal weakness or numbness.  10-point ROS otherwise negative.  ____________________________________________   PHYSICAL EXAM:  VITAL SIGNS: ED Triage Vitals  Enc Vitals Group     BP 10/07/15 1331 131/74 mmHg     Pulse Rate 10/07/15 1331 83     Resp 10/07/15 1331 24     Temp 10/07/15 1331 97.9 F (36.6 C)     Temp Source 10/07/15 1331 Oral      SpO2 10/07/15 1331 79 %     Weight --      Height --      Head Cir --      Peak Flow --      Pain Score --      Pain Loc --      Pain Edu? --      Excl. in [place]? --     Constitutional: Patient is alert and oriented and able to speak in 5 or 6 word sentences. He is able to answer all the questions appropriately. He is mildly tachypnea and remarkably well-appearing 4 and oxygen saturation of 72% on room air.  Eyes: Conjunctivae are normal.  EOMI. Head: Atraumatic. Nose: No congestion/rhinnorhea. Mouth/Throat: Mucous membranes are moist.  Neck: No stridor.  Supple.  No JVD Cardiovascular: Normal rate, regular rhythm. No murmurs, rubs or gallops.  Respiratory: Patient is tachypnea with accessory muscle use and mild retractions. He does not have any wheezes, rales, rhonchi. He has moderate to poor air exchange in the lung fields bilaterally. Gastrointestinal: Soft and nontender. No distention. No peritoneal signs. Musculoskeletal: No LE edema. No calf pain or palpable cords. Negative for Homans sign. Neurologic:  Normal speech and language. No gross focal neurologic deficits are appreciated.  Skin:  Skin is warm, dry and intact. No rash noted. Psychiatric: Mood and affect are normal. Speech and behavior are normal.  Normal judgement.  ____________________________________________   LABS (all labs ordered are listed, but only abnormal results are displayed)  Labs Reviewed  CULTURE, BLOOD (ROUTINE X 2)  CULTURE, BLOOD (ROUTINE X 2)  CBC  BASIC METABOLIC PANEL  BLOOD GAS, VENOUS  TROPONIN I  BRAIN NATRIURETIC PEPTIDE   ____________________________________________  EKG  ED ECG REPORT I, Eula Listen, the attending physician, personally viewed and interpreted this ECG.   Date: 10/07/2015  EKG Time: 13:59   Rate: 77  Rhythm: normal sinus rhythm  Axis: leftward  Intervals:right bundle branch block  ST&T Change: No  STEMI  ____________________________________________  RADIOLOGY  No results found.  ____________________________________________   PROCEDURES  Procedure(s) performed: None  Critical Care performed: Yes, see note.  CRITICAL CARE Performed by: Eula Listen   Total critical care time: 35  Critical care time was exclusive of separately billable procedures and treating other patients.  Critical care was necessary to treat or prevent imminent or life-threatening deterioration.  Critical care was time spent personally  by me on the following activities: development of treatment plan with patient and/or surrogate as well as nursing, discussions with consultants, evaluation of patient's response to treatment, examination of patient, obtaining history from patient or surrogate, ordering and performing treatments and interventions, ordering and review of laboratory studies, ordering and review of radiographic studies, pulse oximetry and re-evaluation of patient's condition.  ____________________________________________   INITIAL IMPRESSION / ASSESSMENT AND PLAN / ED COURSE  Pertinent labs & imaging results that were available during my care of the patient were reviewed by me and considered in my medical decision making (see chart for details).  81 y.o. male with advanced COPD here for shortness of breath. On his 5 L, he is satting in the mid to high 70s. I called respiratory therapist to place him on BiPAP. We will get a VBG to see how his oxygenation is doing, as the patient has mentating normally and looks remarkably well for such a low oxygen saturation. It is likely that his baseline is quite low. He does have a cough so I will evaluate him for pneumonia although he is not giving many other acute infectious symptoms. We will also check for an acute cardiac event such as an MI or arrhythmia. CHF is less likely as there is no clinical indication of fluid overload but this is also  on the differential. I hear breath sounds bilaterally but COPD related pneumothorax is also possible, but much less likely.  ----------------------------------------- 2:09 PM on 10/07/2015 -----------------------------------------  After getting more comfortable in the stretcher, the patient oxygen went up to 88% on 5 L nasal cannula. He says that this is his baseline, so at this time I will await the gas results to make a decision about whether to initiate BiPAP.  ----------------------------------------- 2:17 PM on 10/07/2015 -----------------------------------------  Patient is breathing more comfortably and feeling much better on his home O2.  ----------------------------------------- 2:42 PM on 10/07/2015 -----------------------------------------  Once again D satted to the low 80s despite his home 5 L nasal cannula. Despite his reassuring gas, I have placed him on BiPAP to improve his oxygenation. He is tolerating that well and breathing more comfortably. ____________________________________________  FINAL CLINICAL IMPRESSION(S) / ED DIAGNOSES  Final diagnoses:  None      NEW MEDICATIONS STARTED DURING THIS VISIT:  New Prescriptions   No medications on file     Eula Listen, MD 10/07/15 1512

## 2015-10-07 NOTE — ED Notes (Signed)
Pt reports shortness of breath for past week, worse last night. Productive cough. Pt on 5L O2 and sats 78-81% in triage. No pain.

## 2015-10-08 DIAGNOSIS — J441 Chronic obstructive pulmonary disease with (acute) exacerbation: Secondary | ICD-10-CM | POA: Diagnosis not present

## 2015-10-08 MED ORDER — PREDNISONE 10 MG PO TABS: 10.0000 mg | ORAL_TABLET | Freq: Every day | ORAL | Status: DC

## 2015-10-08 NOTE — Progress Notes (Signed)
IV's were removed. Discharge instructions, follow-up appointments, and prescriptions were provided to the pt. All questions were answered. The pt was taken downstairs via wheelchair by volunteer services.

## 2015-10-08 NOTE — Progress Notes (Signed)
Patient A/O, no noted distress. Remains on 5L N/C. Patient notes he was unable to sleep, but did not request any sleep remedies. Staff will continue to monitor and meet needs. Patient tolerated all meds administered.

## 2015-10-08 NOTE — Discharge Instructions (Signed)
Shortness of Breath Shortness of breath means you have trouble breathing. Shortness of breath needs medical care right away. HOME CARE   Do not smoke.  Avoid being around chemicals or things (paint fumes, dust) that may bother your breathing.  Rest as needed. Slowly begin your normal activities.  Only take medicines as told by your doctor.  Keep all doctor visits as told. GET HELP RIGHT AWAY IF:   Your shortness of breath gets worse.  You feel lightheaded, pass out (faint), or have a cough that is not helped by medicine.  You cough up blood.  You have pain with breathing.  You have pain in your chest, arms, shoulders, or belly (abdomen).  You have a fever.  You cannot walk up stairs or exercise the way you normally do.  You do not get better in the time expected.  You have a hard time doing normal activities even with rest.  You have problems with your medicines.  You have any new symptoms. MAKE SURE YOU:  Understand these instructions.  Will watch your condition.  Will get help right away if you are not doing well or get worse.   This information is not intended to replace advice given to you by your health care provider. Make sure you discuss any questions you have with your health care provider.   Document Released: 05/24/2008 Document Revised: 12/11/2013 Document Reviewed: 02/21/2012 Elsevier Interactive Patient Education 2016 [employer]   Chronic Obstructive Pulmonary Disease Chronic obstructive pulmonary disease (COPD) is a common lung problem. In COPD, the flow of air from the lungs is limited. The way your lungs work will probably never return to normal, but there are things you can do to improve your lungs and make yourself feel better. Your doctor may treat your condition with:  Medicines.  Oxygen.  Lung surgery.  Changes to your diet.  Rehabilitation. This may involve a team of specialists. HOME CARE  Take all medicines as told by your  doctor.  Avoid medicines or cough syrups that dry up your airway (such as antihistamines) and do not allow you to get rid of thick spit. You do not need to avoid them if told differently by your doctor.  If you smoke, stop. Smoking makes the problem worse.  Avoid being around things that make your breathing worse (like smoke, chemicals, and fumes).  Use oxygen therapy and therapy to help improve your lungs (pulmonary rehabilitation) if told by your doctor. If you need home oxygen therapy, ask your doctor if you should buy a tool to measure your oxygen level (oximeter).  Avoid people who have a sickness you can catch (contagious).  Avoid going outside when it is very hot, cold, or humid.  Eat healthy foods. Eat smaller meals more often. Rest before meals.  Stay active, but remember to also rest.  Make sure to get all the shots (vaccines) your doctor recommends. Ask your doctor if you need a pneumonia shot.  Learn and use tips on how to relax.  Learn and use tips on how to control your breathing as told by your doctor. Try:  Breathing in (inhaling) through your nose for 1 second. Then, pucker your lips and breath out (exhale) through your lips for 2 seconds.  Putting one hand on your belly (abdomen). Breathe in slowly through your nose for 1 second. Your hand on your belly should move out. Pucker your lips and breathe out slowly through your lips. Your hand on your belly should move  in as you breathe out.  Learn and use controlled coughing to clear thick spit from your lungs. The steps are:  Lean your head a little forward.  Breathe in deeply.  Try to hold your breath for 3 seconds.  Keep your mouth slightly open while coughing 2 times.  Spit any thick spit out into a tissue.  Rest and do the steps again 1 or 2 times as needed. GET HELP IF:  You cough up more thick spit than usual.  There is a change in the color or thickness of the spit.  It is harder to breathe than  usual.  Your breathing is faster than usual. GET HELP RIGHT AWAY IF:  You have shortness of breath while resting.  You have shortness of breath that stops you from:  Being able to talk.  Doing normal activities.  You chest hurts for longer than 5 minutes.  Your skin color is more blue than usual.  Your pulse oximeter shows that you have low oxygen for longer than 5 minutes. MAKE SURE YOU:  Understand these instructions.  Will watch your condition.  Will get help right away if you are not doing well or get worse.   This information is not intended to replace advice given to you by your health care provider. Make sure you discuss any questions you have with your health care provider.   Document Released: 05/24/2008 Document Revised: 12/27/2014 Document Reviewed: 08/02/2013 Elsevier Interactive Patient Education [employer].

## 2015-10-08 NOTE — Discharge Summary (Signed)
[facility] [place] NAME: Brett Burton    MR#:  [id number]  DATE OF BIRTH:  07-21-1929  DATE OF ADMISSION:  10/07/2015 ADMITTING PHYSICIAN: Fritzi Mandes, MD  DATE OF DISCHARGE: 10/08/2015   PRIMARY CARE PHYSICIAN: [name], [name], MD    ADMISSION DIAGNOSIS:  Shortness of breath [R06.02] Cough [R05] Hypoxia [R09.02]  DISCHARGE DIAGNOSIS:  Active Problems:   Acute on chronic respiratory failure ([place])   SECONDARY DIAGNOSIS:   Past Medical History  Diagnosis Date  . COPD (chronic obstructive pulmonary disease) ([place])   . Hypertension   . 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 For further details please refer the H&P.  1. Acute on chronic hypoxic respiratory failure: This is secondary to COPD exacerbation. Patient is now back on his 5 L of oxygen which he chronically wears. His O2 sat should be between 88-90%.  2. Acute COPD exacerbation: Patient had no evidence of pneumonia on chest x-ray or clinically. Patient was treated with IV steroids and continued on oxygen and nebulizer treatments. Patient was initially placed on BiPAP machine in the emergency room and was thus transitioned to his chronic 5 L oxygen. On examination at discharge patient has clear lung sounds without any wheezing and speaking full sentences without any issues.  3. Pulmonary fibrosis: Patient should follow up with his pulmonologist.  4. History of CAD with stent: Patient will continue aspirin, Plavix, statin and beta blocker  5. Hyperlipidemia: Continue atorvastatin  6. History of chronic atrial fibrillation: Patient's heart rate was controlled. He is currently in normal sinus rhythm. Patient will continue on diltiazem and metoprolol.   DISCHARGE  CONDITIONS AND DIET:  Patient is being discharged home with home health care in stable condition on a regular diet  CONSULTS OBTAINED:     DRUG ALLERGIES:  No Known Allergies  DISCHARGE MEDICATIONS:   Current Discharge Medication List    CONTINUE these medications which have CHANGED   Details  predniSONE (DELTASONE) 10 MG tablet Take 1 tablet (10 mg total) by mouth daily with breakfast. Qty: 42 tablet, Refills: 0      CONTINUE these medications which have NOT CHANGED   Details  aspirin EC 81 MG tablet Take 81 mg by mouth at bedtime.    atorvastatin (LIPITOR) 40 MG tablet Take 40 mg by mouth at bedtime.     clopidogrel (PLAVIX) 75 MG tablet Take 75 mg by mouth at bedtime.     diltiazem (DILACOR XR) 120 MG 24 hr capsule Take 120 mg by mouth daily.    Fluticasone-Salmeterol (ADVAIR) 250-50 MCG/DOSE AEPB Inhale 1 puff into the lungs 2 (two) times daily.    Ipratropium-Albuterol (COMBIVENT RESPIMAT) 20-100 MCG/ACT AERS respimat Inhale 1 puff into the lungs every 6 (six) hours as needed for wheezing or shortness of breath.    isosorbide mononitrate (IMDUR) 30 MG 24 hr tablet Take 30 mg by mouth daily.    metoprolol succinate (TOPROL-XL) 25 MG 24 hr tablet Take 25 mg by mouth daily.    tiotropium (SPIRIVA) 18 MCG inhalation capsule Place 18 mcg into inhaler and inhale daily at 12 noon.       STOP taking these medications     azithromycin (ZITHROMAX) 250 MG tablet  furosemide (LASIX) 20 MG tablet      levofloxacin (LEVAQUIN) 500 MG tablet      methylPREDNISolone (MEDROL DOSEPAK) 4 MG TBPK tablet               Today   CHIEF COMPLAINT:  Patient is doing well this morning. Patient would like to go home as he feels like he is at his baseline. Patient denies shortness of breath.   VITAL SIGNS:  Blood pressure 175/99, pulse 109, temperature 97.7 F (36.5 C), temperature source Oral, resp. rate 19, height 5\' 10"  (1.778 m), weight 84.052 kg (185 lb 4.8 oz), SpO2  89 %.   REVIEW OF SYSTEMS:  Review of Systems  Constitutional: Negative for fever, chills and malaise/fatigue.  HENT: Negative for sore throat.   Eyes: Negative for blurred vision.  Respiratory: Negative for cough, hemoptysis, shortness of breath and wheezing.   Cardiovascular: Negative for chest pain, palpitations and leg swelling.  Gastrointestinal: Negative for nausea, vomiting, abdominal pain, diarrhea and blood in stool.  Genitourinary: Negative for dysuria.  Musculoskeletal: Negative for back pain.  Neurological: Negative for dizziness, tremors and headaches.  Endo/Heme/Allergies: Does not bruise/bleed easily.     PHYSICAL EXAMINATION:  GENERAL:  81 y.o.-year-old patient lying in the bed with no acute distress.  NECK:  Supple, no jugular venous distention. No thyroid enlargement, no tenderness.  LUNGS: Normal breath sounds bilaterally, no wheezing, rales,rhonchi  No use of accessory muscles of respiration.  CARDIOVASCULAR: S1, S2 normal. No murmurs, rubs, or gallops.  ABDOMEN: Soft, non-tender, non-distended. Bowel sounds present. No organomegaly or mass.  EXTREMITIES: No pedal edema, cyanosis, or clubbing.  PSYCHIATRIC: The patient is alert and oriented x 3.  SKIN: No obvious rash, lesion, or ulcer.   DATA REVIEW:   CBC  Recent Labs Lab 10/07/15 1349  WBC 9.1  HGB 15.4  HCT 47.5  PLT 208    Chemistries   Recent Labs Lab 10/07/15 1349  NA 136  K 4.2  CL 101  CO2 26  GLUCOSE 135*  BUN 17  CREATININE 1.19  CALCIUM 9.0    Cardiac Enzymes  Recent Labs Lab 10/07/15 1349  Viola <0.03    Microbiology Results  @[email or web address] @  RADIOLOGY:  Dg Chest 2 View  10/07/2015  CLINICAL DATA:  Shortness of breath for past week, productive cough, COPD, hypertension, history melanoma EXAM: CHEST  2 VIEW COMPARISON:  07/27/2015 FINDINGS: Enlargement of cardiac silhouette. Calcification and mild tortuosity of thoracic aorta. Pulmonary vascularity normal.  Emphysematous changes with chronic diffuse interstitial infiltrates bilaterally. These could represent pulmonary fibrosis, chronic interstitial lung disease, stability since previous exam making recurrent edema or infection less likely. No segmental consolidation, pleural effusion or pneumothorax. Diffuse osseous demineralization. IMPRESSION: Enlargement of cardiac silhouette. COPD changes with chronic BILATERAL interstitial infiltrates favor pulmonary fibrosis or chronic interstitial lung disease over recurrent edema and infection. Electronically Signed   By: Lavonia Dana M.D.   On: 10/07/2015 14:24      Management plans discussed with the patient and he is in agreement. Stable for discharge home  Patient should follow up with PCP in one week  CODE STATUS:     Code Status Orders        Start     Ordered   10/07/15 1640  Full code   Continuous     10/07/15 1639      TOTAL TIME TAKING CARE OF THIS PATIENT: 35 minutes.    Note: This dictation was prepared with Dragon dictation along  with smaller phrase technology. Any transcriptional errors that result from this process are unintentional.  Lannette Avellino M.D on 10/08/2015 at 10:30 AM  Between 7am to 6pm - Pager - [phone] After 6pm go to [email or web address] - password [email or web address] [facility]  [facility]  Office  [phone]  CC: Primary care physician; [facility], Chrissie Noa, MD

## 2015-10-08 NOTE — Care Management Note (Signed)
Case Management Note  Patient Details  Name: Tydarius Yawn MRN: [id number] Date of Birth: 06-18-1929  Subjective/Objective:                 Spoke with patient and spouse concerning discharge planning. Patient is alert oriented from home with spouse and does not drive. Attending physician ordered home health RN and Rt visits.. Patient stated that he was open to [place] prior and would like to continue with this provider. Has O2 delivered to home, concentrator and tanks but unsure of company name. Believes it to be called [employer] out of [place]. Has a walker at home but stated that he does not use it. No difficulty with Meds or making follow up appointments.    Action/Plan:  DC home with home health.   Expected Discharge Date:                  Expected Discharge Plan:  [place]  In-House Referral:     Discharge planning Services  CM Consult  Post Acute Care Choice:  Home Health Choice offered to:  Patient, Spouse  DME Arranged:  N/A DME Agency:  [place] Arranged:  RN, Respirator Therapy [place] Agency:  [place]  Status of Service:  Completed, signed off  Medicare Important Message Given:    Date Medicare IM Given:    Medicare IM give by:    Date Additional Medicare IM Given:    Additional Medicare Important Message give by:     If discussed at [place] of Stay Meetings, dates discussed:    Additional Comments:  Alvie Heidelberg, RN 10/08/2015, 9:57 AM

## 2015-10-12 LAB — CULTURE, BLOOD (ROUTINE X 2)
CULTURE: NO GROWTH
Culture: NO GROWTH

## 2015-11-05 ENCOUNTER — Emergency Department: Payer: Medicare Other

## 2015-11-05 ENCOUNTER — Inpatient Hospital Stay
Admission: EM | Admit: 2015-11-05 | Discharge: 2015-11-07 | DRG: 190 | Disposition: A | Discharge status: Home Health | Payer: Medicare Other | Attending: Internal Medicine | Admitting: Internal Medicine

## 2015-11-05 ENCOUNTER — Encounter: Payer: Self-pay | Admitting: Emergency Medicine

## 2015-11-05 DIAGNOSIS — J441 Chronic obstructive pulmonary disease with (acute) exacerbation: Secondary | ICD-10-CM | POA: Diagnosis present

## 2015-11-05 DIAGNOSIS — I4510 Unspecified right bundle-branch block: Secondary | ICD-10-CM | POA: Diagnosis present

## 2015-11-05 DIAGNOSIS — E785 Hyperlipidemia, unspecified: Secondary | ICD-10-CM | POA: Diagnosis present

## 2015-11-05 DIAGNOSIS — Z85820 Personal history of malignant melanoma of skin: Secondary | ICD-10-CM

## 2015-11-05 DIAGNOSIS — J9621 Acute and chronic respiratory failure with hypoxia: Secondary | ICD-10-CM | POA: Diagnosis present

## 2015-11-05 DIAGNOSIS — R000 Tachycardia, unspecified: Secondary | ICD-10-CM | POA: Diagnosis present

## 2015-11-05 DIAGNOSIS — E222 Syndrome of inappropriate secretion of antidiuretic hormone: Secondary | ICD-10-CM | POA: Diagnosis present

## 2015-11-05 DIAGNOSIS — D72829 Elevated white blood cell count, unspecified: Secondary | ICD-10-CM

## 2015-11-05 DIAGNOSIS — Z7952 Long term (current) use of systemic steroids: Secondary | ICD-10-CM

## 2015-11-05 DIAGNOSIS — J8410 Pulmonary fibrosis, unspecified: Secondary | ICD-10-CM | POA: Diagnosis present

## 2015-11-05 DIAGNOSIS — J440 Chronic obstructive pulmonary disease with acute lower respiratory infection: Secondary | ICD-10-CM | POA: Diagnosis not present

## 2015-11-05 DIAGNOSIS — Z9981 Dependence on supplemental oxygen: Secondary | ICD-10-CM

## 2015-11-05 DIAGNOSIS — Z7902 Long term (current) use of antithrombotics/antiplatelets: Secondary | ICD-10-CM

## 2015-11-05 DIAGNOSIS — Z87891 Personal history of nicotine dependence: Secondary | ICD-10-CM

## 2015-11-05 DIAGNOSIS — J9620 Acute and chronic respiratory failure, unspecified whether with hypoxia or hypercapnia: Secondary | ICD-10-CM | POA: Diagnosis present

## 2015-11-05 DIAGNOSIS — Z79899 Other long term (current) drug therapy: Secondary | ICD-10-CM

## 2015-11-05 DIAGNOSIS — R7989 Other specified abnormal findings of blood chemistry: Secondary | ICD-10-CM

## 2015-11-05 DIAGNOSIS — R778 Other specified abnormalities of plasma proteins: Secondary | ICD-10-CM

## 2015-11-05 DIAGNOSIS — R0902 Hypoxemia: Secondary | ICD-10-CM

## 2015-11-05 DIAGNOSIS — R748 Abnormal levels of other serum enzymes: Secondary | ICD-10-CM | POA: Diagnosis present

## 2015-11-05 DIAGNOSIS — E871 Hypo-osmolality and hyponatremia: Secondary | ICD-10-CM

## 2015-11-05 DIAGNOSIS — N289 Disorder of kidney and ureter, unspecified: Secondary | ICD-10-CM | POA: Diagnosis present

## 2015-11-05 DIAGNOSIS — I5030 Unspecified diastolic (congestive) heart failure: Secondary | ICD-10-CM | POA: Diagnosis present

## 2015-11-05 DIAGNOSIS — J209 Acute bronchitis, unspecified: Secondary | ICD-10-CM

## 2015-11-05 DIAGNOSIS — I2699 Other pulmonary embolism without acute cor pulmonale: Secondary | ICD-10-CM

## 2015-11-05 DIAGNOSIS — I110 Hypertensive heart disease with heart failure: Secondary | ICD-10-CM | POA: Diagnosis present

## 2015-11-05 HISTORY — DX: Heart failure, unspecified: I50.9

## 2015-11-05 LAB — CBC
HCT: 51.9 % (ref 40.0–52.0)
Hemoglobin: 16.8 g/dL (ref 13.0–18.0)
MCH: 26.4 pg (ref 26.0–34.0)
MCHC: 32.5 g/dL (ref 32.0–36.0)
MCV: 81.3 fL (ref 80.0–100.0)
Platelets: 236 10*3/uL (ref 150–440)
RBC: 6.38 MIL/uL — ABNORMAL HIGH (ref 4.40–5.90)
RDW: 20.6 % — AB (ref 11.5–14.5)
WBC: 19.4 10*3/uL — ABNORMAL HIGH (ref 3.8–10.6)

## 2015-11-05 MED ORDER — IPRATROPIUM-ALBUTEROL 0.5-2.5 (3) MG/3ML IN SOLN
3.0000 mL | Freq: Once | RESPIRATORY_TRACT | Status: AC
  Administered 2015-11-05: 3 mL via RESPIRATORY_TRACT
  Filled 2015-11-05: qty 3

## 2015-11-05 MED ORDER — METHYLPREDNISOLONE SODIUM SUCC 125 MG IJ SOLR
125.0000 mg | Freq: Once | INTRAMUSCULAR | Status: AC
  Administered 2015-11-05: 125 mg via INTRAVENOUS
  Filled 2015-11-05: qty 2

## 2015-11-05 NOTE — ED Provider Notes (Signed)
[facility] Emergency Department Provider Note  ____________________________________________  Time seen: Approximately 11:42 PM  I have reviewed the triage vital signs and the nursing notes.   HISTORY  Chief Complaint Shortness of Breath    HPI Brett Burton is a 81 y.o. male with a history of COPD on 6 L of oxygen at baseline as well as a history of CHF who presents with relatively acute onset severe respiratory distress.  He will felt fine earlier today and in fact was visited by home health and they had no issues.  However, approximately 5-6 hours prior to arrival in the emergency department he became acutely short of breath and it has continued until his wife brought him to the emergency department.  In the triage room he was noted to be have an SPO2 of 65% on 6 L of oxygen by nasal cannula, retracting, and pursing his lips.  He was brought immediately to the exam room.  He denies chest pain but does endorse some tightness in the center of his chest which was present when the shortness of breath first began but feels better at this time.  It was mild in severity.  However his respiratory distress is severe and he is using accessory muscles and gasping in spite of his nasal cannula.  He denies lightheadedness/dizziness, weakness, chest pain, abdominal pain, nausea/vomiting, fever/chills.  He was admitted to this hospital approximately one month ago for a COPD exacerbation and has been doing very well according to his wife until the last few hours.  He has been using his nebulizer and inhalers as prescribed.  He has not had any history of ACS nor of stents for CAD.  Past Medical History  Diagnosis Date  . COPD (chronic obstructive pulmonary disease) ([place])   . Hypertension   . Melanoma [facility])     Patient Active Problem List   Diagnosis Date Noted  . Acute on chronic respiratory failure ([place]) 10/07/2015  . Acute diastolic CHF (congestive heart failure) ([place])  04/24/2015  . COPD exacerbation ([place]) 04/24/2015  . Acute on chronic respiratory failure with hypoxemia ([place]) 04/19/2015    Past Surgical History  Procedure Laterality Date  . Colon surgery    . Tonsillectomy    . Appendectomy      No current outpatient prescriptions on file.  Allergies Review of patient's allergies indicates no known allergies.  Family History  Problem Relation Age of Onset  . Hypertension Other     Social History Social History  Substance Use Topics  . Smoking status: Former Research scientist (life sciences)  . Smokeless tobacco: None  . Alcohol Use: No    Review of Systems Constitutional: No fever/chills Eyes: No visual changes. ENT: No sore throat. Cardiovascular: Denies chest pain but some chest pressure/tightness Respiratory: Severe SOB w/ hypoexmia Gastrointestinal: No abdominal pain.  No nausea, no vomiting.  No diarrhea.  No constipation. Genitourinary: Negative for dysuria. Musculoskeletal: Negative for back pain. Skin: Negative for rash. Neurological: Negative for headaches, focal weakness or numbness.  10-point ROS otherwise negative.  ____________________________________________   PHYSICAL EXAM:  VITAL SIGNS: ED Triage Vitals  Enc Vitals Group     BP 11/05/15 2326 135/86 mmHg     Pulse Rate 11/05/15 2326 122     Resp 11/05/15 2326 40     Temp --      Temp src --      SpO2 11/05/15 2326 70 %     Weight 11/05/15 2326 180 lb (81.647 kg)  Height 11/05/15 2326 5\' 9"  (1.753 m)     Head Cir --      Peak Flow --      Pain Score 11/05/15 2327 0     Pain Loc --      Pain Edu? --      Excl. in [place]? --     Constitutional: Alert and oriented. Severe respiratory distress with retractions and accessory muscle usage and spite of his oxygen therapy Eyes: Conjunctivae are normal. PERRL. EOMI. Head: Atraumatic. Nose: No congestion/rhinnorhea. Mouth/Throat: Mucous membranes are moist.  Oropharynx non-erythematous. Neck: No stridor.   Cardiovascular:  Tachycardia, regular rhythm. Grossly normal heart sounds.  Good peripheral circulation. Respiratory: Severe respiratory distress and retractions.  Hypoxemia in the 60s with good waveform on the monitor.  No significant wheezing, crackles in bases but no overt thick coarse breath sounds to suggest flash pulmonary edema. Gastrointestinal: Soft and nontender. No distention. No abdominal bruits. No CVA tenderness. Musculoskeletal: No lower extremity tenderness nor edema.  No joint effusions. Neurologic:  Normal speech and language. No gross focal neurologic deficits are appreciated.  Skin:  Skin is warm, dry and intact. No rash noted.  Pale. Psychiatric: Mood and affect are normal. Speech and behavior are normal.  ____________________________________________   LABS (all labs ordered are listed, but only abnormal results are displayed)  Labs Reviewed  CBC - Abnormal; Notable for the following:    WBC 19.4 (*)    RBC 6.38 (*)    RDW 20.6 (*)    All other components within normal limits  COMPREHENSIVE METABOLIC PANEL - Abnormal; Notable for the following:    Sodium 133 (*)    CO2 21 (*)    Glucose, Bld 202 (*)    ALT 16 (*)    Total Bilirubin 1.7 (*)    All other components within normal limits  MAGNESIUM - Abnormal; Notable for the following:    Magnesium 1.6 (*)    All other components within normal limits  TROPONIN I - Abnormal; Notable for the following:    Troponin I 0.04 (*)    All other components within normal limits  BLOOD GAS, ARTERIAL - Abnormal; Notable for the following:    pCO2 arterial 31 (*)    pO2, Arterial 142 (*)    Bicarbonate 19.6 (*)    Acid-base deficit 4.0 (*)    Allens test (pass/fail) POSITIVE (*)    All other components within normal limits  BASIC METABOLIC PANEL - Abnormal; Notable for the following:    Sodium 132 (*)    Chloride 100 (*)    CO2 21 (*)    Glucose, Bld 220 (*)    Creatinine, Ser 1.35 (*)    Calcium 8.6 (*)    GFR calc non Af Amer 47  (*)    GFR calc Af Amer 54 (*)    All other components within normal limits  CBC - Abnormal; Notable for the following:    WBC 13.2 (*)    RDW 20.4 (*)    All other components within normal limits  FIBRIN DERIVATIVES D-DIMER ([facility] ONLY) - Abnormal; Notable for the following:    Fibrin derivatives D-dimer ([facility]) 1563 (*)    All other components within normal limits  MRSA PCR SCREENING  APTT  PROTIME-INR   ____________________________________________  EKG  ED ECG REPORT I, Doak Mah, the attending physician, personally viewed and interpreted this ECG.   Date: 11/05/2015  EKG Time: 23:29  Rate: 112  Rhythm: sinus tachycardia  Axis: Normal  Intervals:right bundle branch block  ST&T Change: Significant artifact is present making it difficult to interpret precisely, but there appears to be some depression consistent with mild ischemia likely secondary to his acute respiratory failure.  The morphology of the EKG is similar to EKGs in the past.  ____________________________________________  RADIOLOGY   Dg Chest Portable 1 View  11/05/2015  CLINICAL DATA:  Progressive shortness of breath for 6 hours. EXAM: PORTABLE CHEST 1 VIEW COMPARISON:  Radiographs 10/07/2015.  Chest CT 04/18/2015 FINDINGS: Lungs hyperinflated with emphysema. Cardiomegaly and mediastinal contours are unchanged. Reticular opacities at both lung bases are unchanged, favor fibrosis. Pulmonary edema is considered but felt less likely. No confluent airspace disease. No pneumothorax. No large pleural effusion. IMPRESSION: 1. Advanced emphysema and hyperinflation. 2. Reticular opacities at both lung bases are unchanged from prior exams, fibrosis and chronic interstitial lung disease is favored over edema or recurrent infection. Electronically Signed   By: Jeb Levering M.D.   On: 11/05/2015 23:59    ____________________________________________   PROCEDURES  Procedure(s) performed: None  Critical Care  performed: Yes, see critical care note(s)   CRITICAL CARE Performed by: Hinda Kehr   Total critical care time: 30 minutes  Critical care time was exclusive of separately billable procedures and treating other patients.  Critical care was necessary to treat or prevent imminent or life-threatening deterioration.  Critical care was time spent personally by me on the following activities: development of treatment plan with patient and/or surrogate as well as nursing, discussions with consultants, evaluation of patient's response to treatment, examination of patient, obtaining history from patient or surrogate, ordering and performing treatments and interventions, ordering and review of laboratory studies, ordering and review of radiographic studies, pulse oximetry and re-evaluation of patient's condition.  ____________________________________________   INITIAL IMPRESSION / ASSESSMENT AND PLAN / ED COURSE  Pertinent labs & imaging results that were available during my care of the patient were reviewed by me and considered in my medical decision making (see chart for details).  Severe respiratory distress, initiating BiPAP, duonebs in line, Solu-Medrol, and standard workup including chest x-ray and labs.  After starting BiPAP the patient's oxygen saturation increased from 65% to 93% and the patient's color improved.  There is no sign of ACS at this time but I will continue to monitor carefully.  (Note that documentation was delayed due to multiple ED patients requiring immediate care.)   No evidence to suggest ACS, PNA.  Doubt PE.  Feels much better on BiPap.  Will admit for further management of acute on chronic respiratory failure.  ____________________________________________  FINAL CLINICAL IMPRESSION(S) / ED DIAGNOSES  Final diagnoses:  Acute on chronic respiratory failure with hypoxia ([place])      NEW MEDICATIONS STARTED DURING THIS VISIT:  Current Discharge Medication List        Hinda Kehr, MD 11/06/15 [phone]

## 2015-11-05 NOTE — ED Notes (Signed)
Patient presents with probable AECOPD or CHF exacerbation. Patient was seen by home health this am and told that he was ok. Patient began having trouble breathing today at 1700; worsening since. Seen here last month for the same and was admitted for AECOPD.

## 2015-11-06 ENCOUNTER — Inpatient Hospital Stay: Payer: Medicare Other

## 2015-11-06 ENCOUNTER — Encounter: Payer: Self-pay | Admitting: Emergency Medicine

## 2015-11-06 DIAGNOSIS — Z7952 Long term (current) use of systemic steroids: Secondary | ICD-10-CM | POA: Diagnosis not present

## 2015-11-06 DIAGNOSIS — I4510 Unspecified right bundle-branch block: Secondary | ICD-10-CM | POA: Diagnosis present

## 2015-11-06 DIAGNOSIS — E785 Hyperlipidemia, unspecified: Secondary | ICD-10-CM | POA: Diagnosis present

## 2015-11-06 DIAGNOSIS — Z9981 Dependence on supplemental oxygen: Secondary | ICD-10-CM | POA: Diagnosis not present

## 2015-11-06 DIAGNOSIS — R000 Tachycardia, unspecified: Secondary | ICD-10-CM | POA: Diagnosis present

## 2015-11-06 DIAGNOSIS — R748 Abnormal levels of other serum enzymes: Secondary | ICD-10-CM | POA: Diagnosis present

## 2015-11-06 DIAGNOSIS — Z85820 Personal history of malignant melanoma of skin: Secondary | ICD-10-CM | POA: Diagnosis not present

## 2015-11-06 DIAGNOSIS — E222 Syndrome of inappropriate secretion of antidiuretic hormone: Secondary | ICD-10-CM | POA: Diagnosis present

## 2015-11-06 DIAGNOSIS — J441 Chronic obstructive pulmonary disease with (acute) exacerbation: Secondary | ICD-10-CM | POA: Diagnosis present

## 2015-11-06 DIAGNOSIS — J9621 Acute and chronic respiratory failure with hypoxia: Secondary | ICD-10-CM | POA: Diagnosis present

## 2015-11-06 DIAGNOSIS — N289 Disorder of kidney and ureter, unspecified: Secondary | ICD-10-CM | POA: Diagnosis present

## 2015-11-06 DIAGNOSIS — Z79899 Other long term (current) drug therapy: Secondary | ICD-10-CM | POA: Diagnosis not present

## 2015-11-06 DIAGNOSIS — J440 Chronic obstructive pulmonary disease with acute lower respiratory infection: Secondary | ICD-10-CM | POA: Diagnosis present

## 2015-11-06 DIAGNOSIS — J8410 Pulmonary fibrosis, unspecified: Secondary | ICD-10-CM | POA: Diagnosis present

## 2015-11-06 DIAGNOSIS — J209 Acute bronchitis, unspecified: Secondary | ICD-10-CM | POA: Diagnosis present

## 2015-11-06 DIAGNOSIS — Z7902 Long term (current) use of antithrombotics/antiplatelets: Secondary | ICD-10-CM | POA: Diagnosis not present

## 2015-11-06 DIAGNOSIS — Z87891 Personal history of nicotine dependence: Secondary | ICD-10-CM | POA: Diagnosis not present

## 2015-11-06 DIAGNOSIS — I5030 Unspecified diastolic (congestive) heart failure: Secondary | ICD-10-CM | POA: Diagnosis present

## 2015-11-06 DIAGNOSIS — I110 Hypertensive heart disease with heart failure: Secondary | ICD-10-CM | POA: Diagnosis present

## 2015-11-06 LAB — CBC
HEMATOCRIT: 45.9 % (ref 40.0–52.0)
HEMOGLOBIN: 14.9 g/dL (ref 13.0–18.0)
MCH: 27.2 pg (ref 26.0–34.0)
MCHC: 32.4 g/dL (ref 32.0–36.0)
MCV: 84.1 fL (ref 80.0–100.0)
Platelets: 209 10*3/uL (ref 150–440)
RBC: 5.45 MIL/uL (ref 4.40–5.90)
RDW: 20.4 % — ABNORMAL HIGH (ref 11.5–14.5)
WBC: 13.2 10*3/uL — AB (ref 3.8–10.6)

## 2015-11-06 LAB — BLOOD GAS, ARTERIAL
ACID-BASE DEFICIT: 4 mmol/L — AB (ref 0.0–2.0)
ALLENS TEST (PASS/FAIL): POSITIVE — AB
BICARBONATE: 19.6 meq/L — AB (ref 21.0–28.0)
DELIVERY SYSTEMS: POSITIVE
EXPIRATORY PAP: 5
FIO2: 1
Inspiratory PAP: 12
MECHANICAL RATE: 8
O2 Saturation: 99.2 %
PATIENT TEMPERATURE: 37
PH ART: 7.41 (ref 7.350–7.450)
pCO2 arterial: 31 mmHg — ABNORMAL LOW (ref 32.0–48.0)
pO2, Arterial: 142 mmHg — ABNORMAL HIGH (ref 83.0–108.0)

## 2015-11-06 LAB — TROPONIN I: TROPONIN I: 0.04 ng/mL — AB (ref <0.031)

## 2015-11-06 LAB — BASIC METABOLIC PANEL
ANION GAP: 11 (ref 5–15)
BUN: 20 mg/dL (ref 6–20)
CO2: 21 mmol/L — ABNORMAL LOW (ref 22–32)
Calcium: 8.6 mg/dL — ABNORMAL LOW (ref 8.9–10.3)
Chloride: 100 mmol/L — ABNORMAL LOW (ref 101–111)
Creatinine, Ser: 1.35 mg/dL — ABNORMAL HIGH (ref 0.61–1.24)
GFR calc Af Amer: 54 mL/min — ABNORMAL LOW (ref >60)
GFR, EST NON AFRICAN AMERICAN: 47 mL/min — AB (ref >60)
GLUCOSE: 220 mg/dL — AB (ref 65–99)
POTASSIUM: 4.2 mmol/L (ref 3.5–5.1)
SODIUM: 132 mmol/L — AB (ref 135–145)

## 2015-11-06 LAB — COMPREHENSIVE METABOLIC PANEL
ALT: 16 U/L — ABNORMAL LOW (ref 17–63)
AST: 26 U/L (ref 15–41)
Albumin: 3.9 g/dL (ref 3.5–5.0)
Alkaline Phosphatase: 101 U/L (ref 38–126)
Anion gap: 10 (ref 5–15)
BUN: 16 mg/dL (ref 6–20)
CHLORIDE: 102 mmol/L (ref 101–111)
CO2: 21 mmol/L — ABNORMAL LOW (ref 22–32)
Calcium: 8.9 mg/dL (ref 8.9–10.3)
Creatinine, Ser: 1.01 mg/dL (ref 0.61–1.24)
GFR calc Af Amer: >60 mL/min (ref >60)
Glucose, Bld: 202 mg/dL — ABNORMAL HIGH (ref 65–99)
POTASSIUM: 4.3 mmol/L (ref 3.5–5.1)
SODIUM: 133 mmol/L — AB (ref 135–145)
Total Bilirubin: 1.7 mg/dL — ABNORMAL HIGH (ref 0.3–1.2)
Total Protein: 7.1 g/dL (ref 6.5–8.1)

## 2015-11-06 LAB — PROTIME-INR
INR: 1.07
PROTHROMBIN TIME: 14.1 s (ref 11.4–15.0)

## 2015-11-06 LAB — MAGNESIUM: MAGNESIUM: 1.6 mg/dL — AB (ref 1.7–2.4)

## 2015-11-06 LAB — FIBRIN DERIVATIVES D-DIMER (ARMC ONLY): Fibrin derivatives D-dimer (ARMC): 1563 — ABNORMAL HIGH (ref 0–499)

## 2015-11-06 LAB — GLUCOSE, CAPILLARY: GLUCOSE-CAPILLARY: 220 mg/dL — AB (ref 65–99)

## 2015-11-06 LAB — MRSA PCR SCREENING: MRSA BY PCR: NEGATIVE

## 2015-11-06 LAB — TSH: TSH: 1.183 u[IU]/mL (ref 0.350–4.500)

## 2015-11-06 LAB — CORTISOL: CORTISOL PLASMA: 34.6 ug/dL

## 2015-11-06 LAB — APTT: APTT: 27 s (ref 24–36)

## 2015-11-06 MED ORDER — GUAIFENESIN ER 600 MG PO TB12
600.0000 mg | ORAL_TABLET | Freq: Two times a day (BID) | ORAL | Status: DC
  Administered 2015-11-06 – 2015-11-07 (×3): 600 mg via ORAL
  Filled 2015-11-06 (×3): qty 1

## 2015-11-06 MED ORDER — CETYLPYRIDINIUM CHLORIDE 0.05 % MT LIQD
7.0000 mL | Freq: Two times a day (BID) | OROMUCOSAL | Status: DC
  Administered 2015-11-06: 7 mL via OROMUCOSAL

## 2015-11-06 MED ORDER — METHYLPREDNISOLONE SODIUM SUCC 125 MG IJ SOLR
60.0000 mg | INTRAMUSCULAR | Status: DC
  Administered 2015-11-06 – 2015-11-07 (×2): 60 mg via INTRAVENOUS
  Filled 2015-11-06 (×2): qty 2

## 2015-11-06 MED ORDER — ACETAMINOPHEN 650 MG RE SUPP: 650.0000 mg | Freq: Four times a day (QID) | RECTAL | Status: DC | PRN

## 2015-11-06 MED ORDER — DILTIAZEM HCL ER 120 MG PO CP24
120.0000 mg | ORAL_CAPSULE | Freq: Every day | ORAL | Status: DC
  Administered 2015-11-06 – 2015-11-07 (×2): 120 mg via ORAL
  Filled 2015-11-06 (×3): qty 1

## 2015-11-06 MED ORDER — CHLORHEXIDINE GLUCONATE 0.12 % MT SOLN
15.0000 mL | Freq: Two times a day (BID) | OROMUCOSAL | Status: DC
Start: 2015-11-06 — End: 2015-11-07
  Administered 2015-11-06 (×2): 15 mL via OROMUCOSAL
  Filled 2015-11-06 (×2): qty 15

## 2015-11-06 MED ORDER — METOPROLOL SUCCINATE ER 25 MG PO TB24
25.0000 mg | ORAL_TABLET | Freq: Every day | ORAL | Status: DC
  Administered 2015-11-06 – 2015-11-07 (×2): 25 mg via ORAL
  Filled 2015-11-06 (×2): qty 1

## 2015-11-06 MED ORDER — LEVOFLOXACIN IN D5W 750 MG/150ML IV SOLN
750.0000 mg | INTRAVENOUS | Status: DC
  Administered 2015-11-06: 750 mg via INTRAVENOUS
  Filled 2015-11-06 (×3): qty 150

## 2015-11-06 MED ORDER — ACETAMINOPHEN 325 MG PO TABS: 650.0000 mg | ORAL_TABLET | Freq: Four times a day (QID) | ORAL | Status: DC | PRN

## 2015-11-06 MED ORDER — HEPARIN BOLUS VIA INFUSION
5000.0000 [IU] | Freq: Once | INTRAVENOUS | Status: AC
  Administered 2015-11-06: 23:00:00 5000 [IU] via INTRAVENOUS
  Filled 2015-11-06: qty 5000

## 2015-11-06 MED ORDER — MOMETASONE FURO-FORMOTEROL FUM 100-5 MCG/ACT IN AERO
2.0000 | INHALATION_SPRAY | Freq: Two times a day (BID) | RESPIRATORY_TRACT | Status: DC
  Administered 2015-11-06 – 2015-11-07 (×3): 2 via RESPIRATORY_TRACT
  Filled 2015-11-06: qty 8.8

## 2015-11-06 MED ORDER — HEPARIN (PORCINE) IN NACL 100-0.45 UNIT/ML-% IJ SOLN
1400.0000 [IU]/h | INTRAMUSCULAR | Status: DC
  Administered 2015-11-06: 1400 [IU]/h via INTRAVENOUS
  Filled 2015-11-06 (×2): qty 250

## 2015-11-06 MED ORDER — CLOPIDOGREL BISULFATE 75 MG PO TABS
75.0000 mg | ORAL_TABLET | Freq: Every day | ORAL | Status: DC
  Administered 2015-11-06: 75 mg via ORAL
  Filled 2015-11-06: qty 1

## 2015-11-06 MED ORDER — ATORVASTATIN CALCIUM 20 MG PO TABS
40.0000 mg | ORAL_TABLET | Freq: Every day | ORAL | Status: DC
  Administered 2015-11-06: 23:00:00 40 mg via ORAL
  Filled 2015-11-06: qty 2

## 2015-11-06 MED ORDER — ISOSORBIDE MONONITRATE ER 30 MG PO TB24
30.0000 mg | ORAL_TABLET | Freq: Every day | ORAL | Status: DC
  Administered 2015-11-06 – 2015-11-07 (×2): 30 mg via ORAL
  Filled 2015-11-06 (×2): qty 1

## 2015-11-06 MED ORDER — ONDANSETRON HCL 4 MG/2ML IJ SOLN: 4.0000 mg | Freq: Four times a day (QID) | INTRAMUSCULAR | Status: DC | PRN

## 2015-11-06 MED ORDER — ONDANSETRON HCL 4 MG PO TABS: 4.0000 mg | ORAL_TABLET | Freq: Four times a day (QID) | ORAL | Status: DC | PRN

## 2015-11-06 MED ORDER — TIOTROPIUM BROMIDE MONOHYDRATE 18 MCG IN CAPS
18.0000 ug | ORAL_CAPSULE | Freq: Every day | RESPIRATORY_TRACT | Status: DC
  Administered 2015-11-06 – 2015-11-07 (×2): 18 ug via RESPIRATORY_TRACT
  Filled 2015-11-06: qty 5

## 2015-11-06 MED ORDER — MORPHINE SULFATE (PF) 2 MG/ML IV SOLN: 2.0000 mg | INTRAVENOUS | Status: DC | PRN

## 2015-11-06 MED ORDER — SODIUM CHLORIDE 0.9 % IJ SOLN
3.0000 mL | Freq: Two times a day (BID) | INTRAMUSCULAR | Status: DC
  Administered 2015-11-06 – 2015-11-07 (×4): 3 mL via INTRAVENOUS

## 2015-11-06 MED ORDER — ASPIRIN EC 81 MG PO TBEC
81.0000 mg | DELAYED_RELEASE_TABLET | Freq: Every day | ORAL | Status: DC
  Administered 2015-11-06: 23:00:00 81 mg via ORAL
  Filled 2015-11-06 (×2): qty 1

## 2015-11-06 MED ORDER — TECHNETIUM TO 99M ALBUMIN AGGREGATED
3.9080 | Freq: Once | INTRAVENOUS | Status: AC | PRN
  Administered 2015-11-06: 3.908 via INTRAVENOUS

## 2015-11-06 MED ORDER — OXYCODONE HCL 5 MG PO TABS: 5.0000 mg | ORAL_TABLET | ORAL | Status: DC | PRN

## 2015-11-06 MED ORDER — HEPARIN SODIUM (PORCINE) 5000 UNIT/ML IJ SOLN
5000.0000 [IU] | Freq: Three times a day (TID) | INTRAMUSCULAR | Status: DC
  Administered 2015-11-06 (×3): 5000 [IU] via SUBCUTANEOUS
  Filled 2015-11-06 (×3): qty 1

## 2015-11-06 MED ORDER — IPRATROPIUM-ALBUTEROL 0.5-2.5 (3) MG/3ML IN SOLN
3.0000 mL | RESPIRATORY_TRACT | Status: DC
  Administered 2015-11-06 – 2015-11-07 (×9): 3 mL via RESPIRATORY_TRACT
  Filled 2015-11-06 (×12): qty 3

## 2015-11-06 MED ORDER — TECHNETIUM TC 99M DIETHYLENETRIAME-PENTAACETIC ACID
41.0250 | Freq: Once | INTRAVENOUS | Status: DC | PRN
  Administered 2015-11-06: 41.025 via INTRAVENOUS
  Filled 2015-11-06: qty 41.02

## 2015-11-06 NOTE — Progress Notes (Signed)
eLink Physician-Brief Progress Note Patient Name: Brett Burton DOB: [birth year] MRN: [id number]   Date of Service  11/06/2015  HPI/Events of Note  81 yo with copd on 5L, hx of  Pulm Fibrosis, admitted for AECOPD. Recent admission in Oct 2016 for similar presentation.   eICU Interventions  Cont with bipap, steroids, nebs.  Maintain sats >88%      Intervention Category Evaluation Type: New Patient Evaluation  [name] 11/06/2015, 2:50 AM

## 2015-11-06 NOTE — H&P (Signed)
[facility] [place] NAME: Brett Burton    MR#:  [id number]  DATE OF BIRTH:  05-20-1929   DATE OF ADMISSION:  11/05/2015  PRIMARY CARE PHYSICIAN: Sofie Hartigan, MD   REQUESTING/REFERRING PHYSICIAN: Karma Greaser  CHIEF COMPLAINT:   Chief Complaint  Patient presents with  . Shortness of Breath    HISTORY OF PRESENT ILLNESS:  Brett Burton  is a 81 y.o. male with a known history of COPD, chronic respiratory failure 5L nasal cannula Baseline presenting with shortness of breath. He states this morning he was feeling fine, however had acute onset shortness of breath around 5 PM 11/05/2015. He denies any chest pain, fever, chills, cough. Took oxygen saturation at home noticed dropping into the low 80s. On arrival required BiPAP  PAST MEDICAL HISTORY:   Past Medical History  Diagnosis Date  . COPD (chronic obstructive pulmonary disease) ([place])   . Hypertension   . Melanoma ([place])     PAST SURGICAL HISTORY:   Past Surgical History  Procedure Laterality Date  . Colon surgery    . Tonsillectomy    . Appendectomy      SOCIAL HISTORY:   Social History  Substance Use Topics  . Smoking status: Former Research scientist (life sciences)  . Smokeless tobacco: Not on file  . Alcohol Use: No    FAMILY HISTORY:   Family History  Problem Relation Age of Onset  . Hypertension Other     DRUG ALLERGIES:  No Known Allergies  REVIEW OF SYSTEMS:  REVIEW OF SYSTEMS:  CONSTITUTIONAL: Denies fevers, chills, fatigue, weakness.  EYES: Denies blurred vision, double vision, or eye pain.  EARS, NOSE, THROAT: Denies tinnitus, ear pain, hearing loss.  RESPIRATORY: denies cough, positive shortness of breath, denies wheezing  CARDIOVASCULAR: Denies chest pain, palpitations, edema.  GASTROINTESTINAL: Denies nausea, vomiting, diarrhea, abdominal pain.  GENITOURINARY: Denies dysuria, hematuria.  ENDOCRINE: Denies nocturia or thyroid problems. HEMATOLOGIC AND LYMPHATIC:  Denies easy bruising or bleeding.  SKIN: Denies rash or lesions.  MUSCULOSKELETAL: Denies pain in neck, back, shoulder, knees, hips, or further arthritic symptoms.  NEUROLOGIC: Denies paralysis, paresthesias.  PSYCHIATRIC: Denies anxiety or depressive symptoms. Otherwise full review of systems performed by me is negative.   MEDICATIONS AT HOME:   Prior to Admission medications   Medication Sig Start Date End Date Taking? Authorizing Provider  aspirin EC 81 MG tablet Take 81 mg by mouth at bedtime.   Yes Historical Provider, MD  atorvastatin (LIPITOR) 40 MG tablet Take 40 mg by mouth at bedtime.    Yes Historical Provider, MD  clopidogrel (PLAVIX) 75 MG tablet Take 75 mg by mouth at bedtime.    Yes Historical Provider, MD  diltiazem (DILACOR XR) 120 MG 24 hr capsule Take 120 mg by mouth daily.   Yes Historical Provider, MD  Fluticasone-Salmeterol (ADVAIR) 250-50 MCG/DOSE AEPB Inhale 1 puff into the lungs 2 (two) times daily.   Yes Historical Provider, MD  Ipratropium-Albuterol (COMBIVENT RESPIMAT) 20-100 MCG/ACT AERS respimat Inhale 1 puff into the lungs every 6 (six) hours as needed for wheezing or shortness of breath.   Yes Historical Provider, MD  isosorbide mononitrate (IMDUR) 30 MG 24 hr tablet Take 30 mg by mouth daily.   Yes Historical Provider, MD  metoprolol succinate (TOPROL-XL) 25 MG 24 hr tablet Take 25 mg by mouth daily.   Yes Historical Provider, MD  predniSONE (DELTASONE) 10 MG tablet Take 1 tablet (10 mg total) by mouth daily with breakfast. 10/08/15  Yes Bettey Costa, MD  tiotropium (SPIRIVA) 18 MCG inhalation capsule Place 18 mcg into inhaler and inhale daily at 12 noon.    Yes Historical Provider, MD      VITAL SIGNS:  Blood pressure 103/59, pulse 95, resp. rate 18, height 5\' 9"  (1.753 m), weight 180 lb (81.647 kg), SpO2 96 %.  PHYSICAL EXAMINATION:  VITAL SIGNS: Filed Vitals:   11/06/15 0130  BP: 103/59  Pulse: 95  Resp: 18   GENERAL:81 y.o.male currently in  moderate acute distress given respiratory status.  HEAD: Normocephalic, atraumatic.  EYES: Pupils equal, round, reactive to light. Extraocular muscles intact. No scleral icterus.  MOUTH: Moist mucosal membrane. Dentition intact. No abscess noted.  EAR, NOSE, THROAT: Clear without exudates. No external lesions.  NECK: Supple. No thyromegaly. No nodules. No JVD.  PULMONARY: diminished breath sounds without wheeze rails or rhonci. No use of accessory muscles, Good respiratory effort. Poor air entry bilaterally on BiPAP CHEST: Nontender to palpation.  CARDIOVASCULAR: S1 and S2.tachycardic. No murmurs, rubs, or gallops. No edema. Pedal pulses 2+ bilaterally.  GASTROINTESTINAL: Soft, nontender, nondistended. No masses. Positive bowel sounds. No hepatosplenomegaly.  MUSCULOSKELETAL: No swelling, clubbing, or edema. Range of motion full in all extremities.  NEUROLOGIC: Cranial nerves II through XII are intact. No gross focal neurological deficits. Sensation intact. Reflexes intact.  SKIN: No ulceration, lesions, rashes, or cyanosis. Skin warm and dry. Turgor intact.  PSYCHIATRIC: Mood, affect within normal limits. The patient is awake, alert and oriented x 3. Insight, judgment intact.    LABORATORY PANEL:   CBC  Recent Labs Lab 11/05/15 2341  WBC 19.4*  HGB 16.8  HCT 51.9  PLT 236   ------------------------------------------------------------------------------------------------------------------  Chemistries   Recent Labs Lab 11/05/15 2341  NA 133*  K 4.3  CL 102  CO2 21*  GLUCOSE 202*  BUN 16  CREATININE 1.01  CALCIUM 8.9  MG 1.6*  AST 26  ALT 16*  ALKPHOS 101  BILITOT 1.7*   ------------------------------------------------------------------------------------------------------------------  Cardiac Enzymes  Recent Labs Lab 11/05/15 2341  TROPONINI 0.04*    ------------------------------------------------------------------------------------------------------------------  RADIOLOGY:  Dg Chest Portable 1 View  11/05/2015  CLINICAL DATA:  Progressive shortness of breath for 6 hours. EXAM: PORTABLE CHEST 1 VIEW COMPARISON:  Radiographs 10/07/2015.  Chest CT 04/18/2015 FINDINGS: Lungs hyperinflated with emphysema. Cardiomegaly and mediastinal contours are unchanged. Reticular opacities at both lung bases are unchanged, favor fibrosis. Pulmonary edema is considered but felt less likely. No confluent airspace disease. No pneumothorax. No large pleural effusion. IMPRESSION: 1. Advanced emphysema and hyperinflation. 2. Reticular opacities at both lung bases are unchanged from prior exams, fibrosis and chronic interstitial lung disease is favored over edema or recurrent infection. Electronically Signed   By: Jeb Levering M.D.   On: 11/05/2015 23:59    EKG:   Orders placed or performed during the hospital encounter of 11/05/15  . ED EKG  . ED EKG    IMPRESSION AND PLAN:   81 year old Caucasian gentleman history of COPD chronic respiratory failure 5 L nasal cannula Baseline presented with shortness of breath  1. Acute on chronic respiratory failure hypoxia: Currently on BiPAP FiO2 100% saturating 94% continue BiPAP therapy wean as tolerated, DuoNeb's, Solu-Medrol treating for COPD exacerbation given acute onset of symptoms we'll check a d-dimer if positive CT scan to rule out pulmonary embolism however has minimal risk factors, continue medications for COPD 2. Essential hypertension: Metoprolol, and/or 3. Hyperlipidemia unspecified: Statin therapy 4. Venous thrombi embolism prophylactic: Heparin subcutaneous    All  the records are reviewed and case discussed with ED provider. Management plans discussed with the patient, family and they are in agreement.  CODE STATUS: Full  TOTAL TIME TAKING CARE OF THIS PATIENT: 45 critical care minutes.     Hower,  Karenann Cai.D on 11/06/2015 at 1:47 AM  Between 7am to 6pm - Pager - [phone]  After 6pm: House Pager: - [place] [facility]  Office  [phone]  CC: Primary care physician; [facility], Chrissie Noa, MD

## 2015-11-06 NOTE — Progress Notes (Signed)
Inpatient Diabetes Program Recommendations  AACE/ADA: New Consensus Statement on Inpatient Glycemic Control (2015)  Target Ranges:  Prepandial:   less than 140 mg/dL      Peak postprandial:   less than 180 mg/dL (1-2 hours)      Critically ill patients:  140 - 180 mg/dL  Results for RHYE, KNOPE (MRN [id number]) as of 11/06/2015 10:44  Ref. Range 11/06/2015 06:22  Glucose Latest Ref Range: 65-99 mg/dL 220 (H)   Review of Glycemic Control  Diabetes history: No Outpatient Diabetes medications: NA Current orders for Inpatient glycemic control: None  Inpatient Diabetes Program Recommendations: Correction (SSI): Fasting lab glucose 220 mg/dl this morning. While inpatient and ordered steroids, please consider ordering CBGs with Novolog correction scale. HgbA1C: Please consider ordering an A1C to evaluate glycemic control over the past 2-3 months.  Thanks, Barnie Alderman, RN, MSN, CDE Diabetes Coordinator Inpatient Diabetes Program [phone] (Team Pager from [place] to [place]) [phone] ([facility] office) [phone] [facility] office) [phone] [facility] office)

## 2015-11-06 NOTE — Evaluation (Signed)
Physical Therapy Evaluation Patient Details Name: Brett Burton MRN: [id number] DOB: 11-06-1929 Today's Date: 11/06/2015   History of Present Illness  81 yo male with onset of emphysema hyperinflation, acute bronchitis and AECOPD.  PMHx:   chronic lung disease and melanoma  Clinical Impression  Pt was assessed at a beginning level of mobility due to his O2 sats being so low.  Pt is baseline receiving 5L O2 and his expectations for tolerance of activity may be lower as a result.  Will try to see pt regularly as he is very limited and recommend SNF for skilled observation and care with his complex condition.    Follow Up Recommendations SNF    Equipment Recommendations  Rolling walker with 5" wheels    Recommendations for Other Services       Precautions / Restrictions Precautions Precautions: Fall (telemetry) Restrictions Weight Bearing Restrictions: No      Mobility  Bed Mobility Overal bed mobility: Modified Independent                Transfers Overall transfer level:  (not able to stand due to sats)                  Ambulation/Gait             General Gait Details: hold due to sats  Stairs            Wheelchair Mobility    Modified Rankin (Stroke Patients Only)       Balance Overall balance assessment: Needs assistance Sitting-balance support: Bilateral upper extremity supported Sitting balance-Leahy Scale: Fair                                       Pertinent Vitals/Pain Pain Assessment: No/denies pain    Home Living Family/patient expects to be discharged to:: Private residence Living Arrangements: Alone                    Prior Function Level of Independence: Independent               Hand Dominance   Dominant Hand: Right    Extremity/Trunk Assessment   Upper Extremity Assessment: Overall WFL for tasks assessed           Lower Extremity Assessment:  (low tolerance for activity so  not fully assessed)      Cervical / Trunk Assessment: Normal  Communication   Communication: No difficulties  Cognition Arousal/Alertness: Awake/alert Behavior During Therapy: WFL for tasks assessed/performed (SOB at rest) Overall Cognitive Status: Within Functional Limits for tasks assessed                      General Comments General comments (skin integrity, edema, etc.): Pt got up to bedside and due to his low O2 sats sitting was not able to stand.  After returning to bed did not get recover immediately and informed nursing of same.      Exercises        Assessment/Plan    PT Assessment Patient needs continued PT services  PT Diagnosis Generalized weakness   PT Problem List Decreased strength;Decreased activity tolerance;Decreased balance;Decreased mobility;Cardiopulmonary status limiting activity;Decreased skin integrity  PT Treatment Interventions DME instruction;Gait training;Stair training;Functional mobility training;Therapeutic activities;Therapeutic exercise;Balance training;Neuromuscular re-education;Patient/family education   PT Goals (Current goals can be found in the Care Plan section) Acute Rehab PT Goals Patient  Stated Goal: none stated PT Goal Formulation: With patient Time For Goal Achievement: 11/20/15 Potential to Achieve Goals: Good    Frequency Min 2X/week   Barriers to discharge Decreased caregiver support home alone    Co-evaluation               End of Session Equipment Utilized During Treatment: Oxygen Activity Tolerance: Treatment limited secondary to medical complications (Comment);Patient limited by fatigue Patient left: in bed;with call bell/phone within reach;with family/visitor present Nurse Communication: Mobility status;Other (comment) (low O2 sats)         Time: [id number] PT Time Calculation (min) (ACUTE ONLY): 13 min   Charges:   PT Evaluation $Initial PT Evaluation Tier I: 1 Procedure     PT G Codes:         Brett Burton 11/21/15, 6:00 PM   Brett Burton, PT MS Acute Rehab Dept. Number: [facility] [id number] and [place] [phone]

## 2015-11-06 NOTE — Progress Notes (Signed)
Patient transferring to room 132 this afternoon. Report has been called to receiving nurse. Patient and family are aware and agreeable to transfer.

## 2015-11-06 NOTE — Care Management (Signed)
Patient admitted with acute respiratory failure due to exac of COPD.  He has home 02 and informed baseline is 5 liters continuous.  Patient was on bipap initially but is now back on nasal cannula.  he is currently followed by [facility].  Notified agency of admission.  Patient has also readmitted with 30 days of a previous discharge.  Will contact physician advisor about [facility] referral.

## 2015-11-06 NOTE — Progress Notes (Signed)
[facility] [place] NAME: Brett Burton    MR#:  [id number]  DATE OF BIRTH:  October 25, 1929  SUBJECTIVE:  CHIEF COMPLAINT:   Chief Complaint  Patient presents with  . Shortness of Breath   patient is 81 year old Caucasian male with past medical history significant for history of chronic respiratory failure due to pulmonary fibrosis who presents to the hospital with complaints of shortness of breath, minimal cough. According to the admitting physician. He had acute onset of shortness of breath at around 5 PM on the day of admission and his O2 SATS dropped down to 80s. Chest x-ray on arrival to emergency room, revealed advanced emphysema and hyperinflation as well as reticular opacities in both lung bases, which were not changed from before. The patient was admitted to the hospital, initiated on steroids as well as nebulizing treatment and he is back to his usual oxygen doses which are 5 L of oxygen through nasal cannula. He feels comfortable, still some shortness of breath  Review of Systems  Constitutional: Negative for fever, chills and weight loss.  HENT: Negative for congestion.   Eyes: Negative for blurred vision and double vision.  Respiratory: Positive for cough and shortness of breath. Negative for sputum production and wheezing.   Cardiovascular: Negative for chest pain, palpitations, orthopnea, leg swelling and PND.  Gastrointestinal: Negative for nausea, vomiting, abdominal pain, diarrhea, constipation and blood in stool.  Genitourinary: Negative for dysuria, urgency, frequency and hematuria.  Musculoskeletal: Negative for falls.  Neurological: Negative for dizziness, tremors, focal weakness and headaches.  Endo/Heme/Allergies: Does not bruise/bleed easily.  Psychiatric/Behavioral: Negative for depression. The patient does not have insomnia.     VITAL SIGNS: Blood pressure 115/69, pulse 85, temperature 97.9 F (36.6 C), temperature  source Oral, resp. rate 24, height 5\' 10"  (1.778 m), weight 80.9 kg (178 lb 5.6 oz), SpO2 85 %.  PHYSICAL EXAMINATION:   GENERAL:  81 y.o.-year-old patient lying in the bed in mild respiratory distress.  EYES: Pupils equal, round, reactive to light and accommodation. No scleral icterus. Extraocular muscles intact.  HEENT: Head atraumatic, normocephalic. Oropharynx and nasopharynx clear.  NECK:  Supple, no jugular venous distention. No thyroid enlargement, no tenderness.  LUNGS: Tachypneic whenever moves around, diminished breath sounds bilaterally, no wheezing, right-sided posterior lung field rhonchi , but no crepitation. Intermittent use of accessory muscles of respiration, mostly  on exertion .  CARDIOVASCULAR: S1, S2 normal. No murmurs, rubs, or gallops.  ABDOMEN: Soft, nontender, nondistended. Bowel sounds present. No organomegaly or mass.  EXTREMITIES: No pedal edema, cyanosis, or clubbing.  NEUROLOGIC: Cranial nerves II through XII are intact. Muscle strength 5/5 in all extremities. Sensation intact. Gait not checked.  PSYCHIATRIC: The patient is alert and oriented x 3.  SKIN: No obvious rash, lesion, or ulcer.   ORDERS/RESULTS REVIEWED:   CBC  Recent Labs Lab 11/05/15 2341 11/06/15 0622  WBC 19.4* 13.2*  HGB 16.8 14.9  HCT 51.9 45.9  PLT 236 209  MCV 81.3 84.1  MCH 26.4 27.2  MCHC 32.5 32.4  RDW 20.6* 20.4*   ------------------------------------------------------------------------------------------------------------------  Chemistries   Recent Labs Lab 11/05/15 2341 11/06/15 0622  NA 133* 132*  K 4.3 4.2  CL 102 100*  CO2 21* 21*  GLUCOSE 202* 220*  BUN 16 20  CREATININE 1.01 1.35*  CALCIUM 8.9 8.6*  MG 1.6*  --   AST 26  --   ALT 16*  --   ALKPHOS 101  --  BILITOT 1.7*  --    ------------------------------------------------------------------------------------------------------------------ estimated creatinine clearance is 42.1 mL/min (by C-G formula  based on Cr of 1.35). ------------------------------------------------------------------------------------------------------------------ No results for input(s): TSH, T4TOTAL, T3FREE, THYROIDAB in the last 72 hours.  Invalid input(s): FREET3  Cardiac Enzymes  Recent Labs Lab 11/05/15 2341  TROPONINI 0.04*   ------------------------------------------------------------------------------------------------------------------ Invalid input(s): POCBNP ---------------------------------------------------------------------------------------------------------------  RADIOLOGY: Dg Chest Portable 1 View  11/05/2015  CLINICAL DATA:  Progressive shortness of breath for 6 hours. EXAM: PORTABLE CHEST 1 VIEW COMPARISON:  Radiographs 10/07/2015.  Chest CT 04/18/2015 FINDINGS: Lungs hyperinflated with emphysema. Cardiomegaly and mediastinal contours are unchanged. Reticular opacities at both lung bases are unchanged, favor fibrosis. Pulmonary edema is considered but felt less likely. No confluent airspace disease. No pneumothorax. No large pleural effusion. IMPRESSION: 1. Advanced emphysema and hyperinflation. 2. Reticular opacities at both lung bases are unchanged from prior exams, fibrosis and chronic interstitial lung disease is favored over edema or recurrent infection. Electronically Signed   By: Jeb Levering M.D.   On: 11/05/2015 23:59    EKG:  Orders placed or performed during the hospital encounter of 11/05/15  . ED EKG  . ED EKG    ASSESSMENT AND PLAN:  Principal Problem:   Acute on chronic respiratory failure ([place]) 1. Acute on chronic respiratory failure with hypoxia, etiology remains unclear, suspected acute bronchitis, getting oxygen therapy, get VQ scan to rule out PE 2. COPD exacerbation  . Continue steroids, inhalation therapy added levofloxacin. Follow clinically , adding Humibid, 3. Acute bronchitis , but antibiotic therapy with levofloxacin, get sputum cultures if possible ,  4.  . Hyponatremia , apparently chronic , possibly SIADH. Get a urine, serum osmolarity , TSH, as well as cortisol levels  5.renal insufficiency , rule out obstruction. Bladder scan . Follow in the morning   Management plans discussed with the patient, family and they are in agreement.   DRUG ALLERGIES: No Known Allergies  CODE STATUS:     Code Status Orders        Start     Ordered   11/06/15 0111  Full code   Continuous     11/06/15 0111      TOTAL CRITICAL CARE TIME TAKING CARE OF THIS PATIENT:45nutes.    Theodoro Grist M.D on 11/06/2015 at 2:08 PM  Between 7am to 6pm - Pager - [phone]  After 6pm go to [email or web address] - [email or web address] [facility]  [place] [facility]  Office  [phone]  CC: Primary care physician; [facility], Chrissie Noa, MD

## 2015-11-06 NOTE — Progress Notes (Signed)
Transported pt on Bipap without incident. Pt remains on Bipap and is tol well at this time.

## 2015-11-06 NOTE — Progress Notes (Signed)
ANTICOAGULATION CONSULT NOTE - Initial Consult  Pharmacy Consult for Heparin drip Indication: pulmonary embolus  No Known Allergies  Patient Measurements: Height: 5\' 10"  (177.8 cm) Weight: 180 lb (81.647 kg) IBW/kg (Calculated) : 73 Heparin Dosing Weight: 81.6 kg  Vital Signs: Temp: 97.5 F (36.4 C) (11/17 1547) Temp Source: Oral (11/17 1547) BP: 125/67 mmHg (11/17 1547) Pulse Rate: 84 (11/17 1547)  Labs:  Recent Labs  11/05/15 2341 11/06/15 0622  HGB 16.8 14.9  HCT 51.9 45.9  PLT 236 209  APTT 27  --   LABPROT 14.1  --   INR 1.07  --   CREATININE 1.01 1.35*  TROPONINI 0.04*  --     Estimated Creatinine Clearance: 42.1 mL/min (by C-G formula based on Cr of 1.35).   Medical History: Past Medical History  Diagnosis Date  . COPD (chronic obstructive pulmonary disease) ([place])   . Hypertension   . Melanoma ([place])     Medications:  Scheduled:  . antiseptic oral rinse  7 mL Mouth Rinse q12n4p  . aspirin EC  81 mg Oral QHS  . atorvastatin  40 mg Oral QHS  . chlorhexidine  15 mL Mouth Rinse BID  . clopidogrel  75 mg Oral QHS  . diltiazem  120 mg Oral Daily  . guaiFENesin  600 mg Oral BID  . heparin  5,000 Units Intravenous Once  . ipratropium-albuterol  3 mL Nebulization Q4H  . isosorbide mononitrate  30 mg Oral Daily  . levofloxacin (LEVAQUIN) IV  750 mg Intravenous Q24H  . methylPREDNISolone (SOLU-MEDROL) injection  60 mg Intravenous Q24H  . metoprolol succinate  25 mg Oral Daily  . mometasone-formoterol  2 puff Inhalation BID  . sodium chloride  3 mL Intravenous Q12H  . tiotropium  18 mcg Inhalation Q1200   Infusions:  . heparin      Assessment: Patient with PE to be initiated on Heparin drip.  Goal of Therapy:  Heparin level 0.3-0.7 units/ml Monitor platelets by anticoagulation protocol: Yes   Plan:  Give 5000 units bolus x 1 Start heparin infusion at 1400 units/hr Check anti-Xa level in 8 hours and daily while on heparin Continue to monitor  H&H and platelets  Paulina Fusi, PharmD, BCPS 11/06/2015 7:03 PM

## 2015-11-06 NOTE — Plan of Care (Signed)
Problem: Acute Rehab PT Goals(only PT should resolve) Goal: Patient Will Transfer Sit To/From Stand With O2 sats 90% or greater Goal: Pt Will Transfer Bed To Chair/Chair To Bed With O2 sats 90% or greater Goal: Pt Will Ambulate With O2 sats 90% or greater

## 2015-11-06 NOTE — Progress Notes (Signed)
[facility]  Patient Status: Active [facility] is providing the following services: Skilled Nursing  If patient discharges after hours, please call [phone].   Brett Burton Brett Burton 11/06/2015, 2:15 PM

## 2015-11-07 ENCOUNTER — Inpatient Hospital Stay: Payer: Medicare Other

## 2015-11-07 DIAGNOSIS — E871 Hypo-osmolality and hyponatremia: Secondary | ICD-10-CM

## 2015-11-07 DIAGNOSIS — R7989 Other specified abnormal findings of blood chemistry: Secondary | ICD-10-CM

## 2015-11-07 DIAGNOSIS — N289 Disorder of kidney and ureter, unspecified: Secondary | ICD-10-CM

## 2015-11-07 DIAGNOSIS — J209 Acute bronchitis, unspecified: Secondary | ICD-10-CM

## 2015-11-07 DIAGNOSIS — R778 Other specified abnormalities of plasma proteins: Secondary | ICD-10-CM

## 2015-11-07 DIAGNOSIS — D72829 Elevated white blood cell count, unspecified: Secondary | ICD-10-CM

## 2015-11-07 LAB — HEPARIN LEVEL (UNFRACTIONATED): Heparin Unfractionated: 1.18 IU/mL — ABNORMAL HIGH (ref 0.30–0.70)

## 2015-11-07 LAB — BASIC METABOLIC PANEL
ANION GAP: 8 (ref 5–15)
BUN: 21 mg/dL — AB (ref 6–20)
CALCIUM: 8.7 mg/dL — AB (ref 8.9–10.3)
CO2: 26 mmol/L (ref 22–32)
CREATININE: 1.03 mg/dL (ref 0.61–1.24)
Chloride: 98 mmol/L — ABNORMAL LOW (ref 101–111)
GFR calc Af Amer: >60 mL/min (ref >60)
GLUCOSE: 149 mg/dL — AB (ref 65–99)
Potassium: 4.7 mmol/L (ref 3.5–5.1)
Sodium: 132 mmol/L — ABNORMAL LOW (ref 135–145)

## 2015-11-07 LAB — CBC
HCT: 45 % (ref 40.0–52.0)
Hemoglobin: 14.9 g/dL (ref 13.0–18.0)
MCH: 27.2 pg (ref 26.0–34.0)
MCHC: 33.2 g/dL (ref 32.0–36.0)
MCV: 82.1 fL (ref 80.0–100.0)
Platelets: 211 10*3/uL (ref 150–440)
RBC: 5.48 MIL/uL (ref 4.40–5.90)
RDW: 20.4 % — AB (ref 11.5–14.5)
WBC: 14.8 10*3/uL — ABNORMAL HIGH (ref 3.8–10.6)

## 2015-11-07 MED ORDER — GUAIFENESIN ER 600 MG PO TB12: 600.0000 mg | ORAL_TABLET | Freq: Two times a day (BID) | ORAL | Status: DC

## 2015-11-07 MED ORDER — IPRATROPIUM-ALBUTEROL 0.5-2.5 (3) MG/3ML IN SOLN: 3.0000 mL | RESPIRATORY_TRACT | Status: AC

## 2015-11-07 MED ORDER — LEVOFLOXACIN 750 MG PO TABS: 750.0000 mg | ORAL_TABLET | Freq: Every day | ORAL | Status: DC

## 2015-11-07 MED ORDER — METHYLPREDNISOLONE 4 MG PO TBPK: ORAL_TABLET | ORAL | Status: DC

## 2015-11-07 MED ORDER — IPRATROPIUM-ALBUTEROL 0.5-2.5 (3) MG/3ML IN SOLN: 3.0000 mL | RESPIRATORY_TRACT | Status: DC

## 2015-11-07 MED ORDER — HEPARIN (PORCINE) IN NACL 100-0.45 UNIT/ML-% IJ SOLN
1100.0000 [IU]/h | INTRAMUSCULAR | Status: DC
Start: 2015-11-07 — End: 2015-11-07
  Administered 2015-11-07: 13:00:00 1100 [IU]/h via INTRAVENOUS

## 2015-11-07 MED ORDER — ALUM & MAG HYDROXIDE-SIMETH 200-200-20 MG/5ML PO SUSP: 30.0000 mL | ORAL | Status: DC | PRN

## 2015-11-07 MED ORDER — GUAIFENESIN ER 600 MG PO TB12: 600.0000 mg | ORAL_TABLET | Freq: Two times a day (BID) | ORAL | Status: AC

## 2015-11-07 MED ORDER — IOHEXOL 350 MG/ML SOLN
100.0000 mL | Freq: Once | INTRAVENOUS | Status: AC | PRN
  Administered 2015-11-07: 12:00:00 100 mL via INTRAVENOUS

## 2015-11-07 NOTE — Progress Notes (Signed)
ANTICOAGULATION CONSULT NOTE -FOLLOW UP   Pharmacy Consult for Heparin drip Indication: pulmonary embolus  No Known Allergies  Patient Measurements: Height: 5\' 10"  (177.8 cm) Weight: 180 lb (81.647 kg) IBW/kg (Calculated) : 73 Heparin Dosing Weight: 81.6 kg  Vital Signs: Temp: 97.4 F (36.3 C) (11/18 0616) Temp Source: Oral (11/18 0616) BP: 140/75 mmHg (11/18 0616) Pulse Rate: 96 (11/18 0616)  Labs:  Recent Labs  11/05/15 2341 11/06/15 0622 11/07/15 0647  HGB 16.8 14.9 14.9  HCT 51.9 45.9 45.0  PLT 236 209 211  APTT 27  --   --   LABPROT 14.1  --   --   INR 1.07  --   --   HEPARINUNFRC  --   --  1.18*  CREATININE 1.01 1.35* 1.03  TROPONINI 0.04*  --   --     Estimated Creatinine Clearance: 55.1 mL/min (by C-G formula based on Cr of 1.03).   Medical History: Past Medical History  Diagnosis Date  . COPD (chronic obstructive pulmonary disease) ([place])   . Hypertension   . Melanoma ([place])     Medications:  Scheduled:  . antiseptic oral rinse  7 mL Mouth Rinse q12n4p  . aspirin EC  81 mg Oral QHS  . atorvastatin  40 mg Oral QHS  . chlorhexidine  15 mL Mouth Rinse BID  . clopidogrel  75 mg Oral QHS  . diltiazem  120 mg Oral Daily  . guaiFENesin  600 mg Oral BID  . ipratropium-albuterol  3 mL Nebulization Q4H  . isosorbide mononitrate  30 mg Oral Daily  . levofloxacin (LEVAQUIN) IV  750 mg Intravenous Q24H  . methylPREDNISolone (SOLU-MEDROL) injection  60 mg Intravenous Q24H  . metoprolol succinate  25 mg Oral Daily  . mometasone-formoterol  2 puff Inhalation BID  . sodium chloride  3 mL Intravenous Q12H  . tiotropium  18 mcg Inhalation Q1200   Infusions:  . heparin      Assessment: Patient with PE to be initiated on Heparin drip.  Goal of Therapy:  Heparin level 0.3-0.7 units/ml Monitor platelets by anticoagulation protocol: Yes   Plan:  Give 5000 units bolus x 1 Start heparin infusion at 1400 units/hr Check anti-Xa level in 8 hours and daily  while on heparin Continue to monitor H&H and platelets   11/18: Heparin level that was drawn @ ~07:00 resulted @ 1.18, which is supratherapeutic. Spoke with RN to hold heparin for an hour. Will restart heparin gtt @ 1100 units/hr. Will order another heparin level to be drawn @19 :00.    Larene Beach, PharmD  11/07/2015 9:38 AM

## 2015-11-07 NOTE — Plan of Care (Signed)
Problem: Bowel/Gastric: Goal: Will not experience complications related to bowel motility Outcome: Progressing Plan of Care Progress to Goal:   Pt denies pain. Pt report feeling like indigestion in chest. MD paged and new orders placed. Pt has heparin going at 48ml/hr. Pt has been resting comfortably the rest of the shift. No other signs of distress noted. Will continue to monitor.

## 2015-11-07 NOTE — Care Management Important Message (Signed)
Important Message  Patient Details  Name: Brett Burton MRN: [id number] Date of Birth: 16-Jan-1929   Medicare Important Message Given:  Yes    Beverly Sessions, RN 11/07/2015, 10:09 AM

## 2015-11-07 NOTE — Progress Notes (Signed)
Printed prescriptions given. Patient discharged home with wife via wheelchair by nursing staff. Madlyn Frankel, RN

## 2015-11-07 NOTE — Progress Notes (Signed)
Discharge instructions given and went over with patient and family at bedside. All questions answered. Awaiting Dr. Ether Griffins for prescription clarification. Madlyn Frankel, RN

## 2015-11-07 NOTE — Clinical Social Work Note (Signed)
Clinical Social Worker was consulted for SNF. CSW spoke to pt regarding discharge plan. Pt declined SNF. CSW updated RNCM. CSW is signing off as no further needs identified.   Darden Dates, MSW, LCSW Clinical Social Worker [phone]

## 2015-11-07 NOTE — Evaluation (Signed)
Occupational Therapy Evaluation Patient Details Name: Brett Burton MRN: [id number] DOB: 1929/10/30 Today's Date: 11/07/2015    History of Present Illness 81 yo male with onset of emphysema hyperinflation, acute bronchitis and AECOPD.  PMHx:   chronic lung disease and melanoma   Clinical Impression   Pt seen in room with his wife and several other family members for OT evaluation.  He was alert and cooperative but appears to be at baseline for ADLs as long as he incorporates more rest breaks.  His wife helps him with donning and doffing socks and shoes and he is able to don/doff pants over feet in seated position.  Rec use of elastic shoes laces and reacher and LH shoe horn but he declined.  His wife has a reacher, sock aid and LH shoe horn at home from a knee replacement she had a year ago.  Pt is very limited in functional tasks due to SOB but is able to complete basic ADLs. Reviewed energy conservation tech and purse lip breathing which he has been following.  He has a shower chair with back with hand held shower and grab bars in shower and a raised toilet seat.  Rec replacing the stool he has in bathroom with a chair with back to prevent falls.  No further OT recommended.     Follow Up Recommendations  No OT follow up    Equipment Recommendations   (rec elastic shoe laces and LH shoe horn to decreased burden on his wife doing it for him but he declined)    Recommendations for Other Services       Precautions / Restrictions Precautions Precautions: Fall Restrictions Weight Bearing Restrictions: No      Mobility Bed Mobility                  Transfers                      Balance                                            ADL Overall ADL's : At baseline                                       General ADL Comments: Pt requires assistance for donning and doffing socks and shoes which his wife helps him with.  He currently  requires same level of assistance as baseline with more frequent rest breaks.  Reviewed energy conservation tech and pursed lip breathing with him and his family.  Also rec using reacher and limiting forward flexion of hips and to take more breaks.  He has several chairs throughout the house to sit on when dressing in seated positon and when ambulationg to the living room to get to his recliner.  Reviewed use of sock aid and LH shoe horn but he declined and preferred to have his wife do it for him.       Vision     Perception     Praxis      Pertinent Vitals/Pain Pain Assessment: No/denies pain     Hand Dominance Right   Extremity/Trunk Assessment Upper Extremity Assessment Upper Extremity Assessment: Overall WFL for tasks assessed   Lower Extremity Assessment Lower Extremity Assessment: Defer to  PT evaluation       Communication Communication Communication: No difficulties   Cognition Arousal/Alertness: Awake/alert Behavior During Therapy: WFL for tasks assessed/performed Overall Cognitive Status: Within Functional Limits for tasks assessed                     General Comments       Exercises       Shoulder Instructions      Home Living Family/patient expects to be discharged to:: Private residence Living Arrangements: Spouse/significant other Available Help at Discharge: Family Type of Home: [place]: One level     Bathroom Shower/Tub: Occupational psychologist: Handicapped height Bathroom Accessibility: Yes How Accessible: Accessible via walker Home Equipment: Bedside commode;Adaptive equipment;Grab bars - tub/shower;Hand held shower head;Shower seat Adaptive Equipment: Psychologist, sport and exercise Comments: his wife had the reacher and sock aid from haivng knee replaced last year and knows how to use them.      Prior Functioning/Environment Level of Independence: Needs assistance    ADL's / Homemaking Assistance Needed:  Pt was dependent on wife for all cooking, cleaning and laundry tasks and she helped him with sock and shoes prior to this admission.  He gets up to go sit in recliner and needs several rest breaks to achieve this each day with 5L of O2.        OT Diagnosis:     OT Problem List:     OT Treatment/Interventions:      OT Goals(Current goals can be found in the care plan section)    OT Frequency:     Barriers to D/C:            Co-evaluation              End of Session Equipment Utilized During Treatment:  (adaptive equip catalog)  Activity Tolerance: Patient limited by fatigue Patient left: in bed;with call bell/phone within reach;with bed alarm set;with family/visitor present   Time: [id number] OT Time Calculation (min): 30 min Charges:  OT General Charges $OT Visit: 1 Procedure OT Evaluation $Initial OT Evaluation Tier I: 1 Procedure OT Treatments $Self Care/Home Management : 8-22 mins G-Codes:    Yuvan Medinger 11/16/15, 1:23 PM    Chrys Racer, OTR/L ascom [phone]

## 2015-11-07 NOTE — Care Management (Signed)
PT has recommended SNF.  Patient adamantly declines SNF placement at this time.  Patient is currently open with [facility].  Patient has been made [place].  MD to order resumption of home health orders and complete face to face.  Home  Nebulizer has been ordered, Will from [facility] notified. RNCM singing off

## 2015-11-08 NOTE — Discharge Summary (Signed)
[facility] NAME: Brett Burton    MR#:  [id number]  DATE OF BIRTH:  Aug 23, 1929  DATE OF ADMISSION:  11/05/2015 ADMITTING PHYSICIAN: [name], MD  DATE OF DISCHARGE: 11/07/2015  5:12 PM  PRIMARY CARE PHYSICIAN: [name], [name], MD     ADMISSION DIAGNOSIS:  Acute on chronic respiratory failure with hypoxia (HCC) [J96.21]  DISCHARGE DIAGNOSIS:  Principal Problem:   Acute on chronic respiratory failure (HCC) Active Problems:   Acute bronchitis   Elevated troponin   Leukocytosis   Renal insufficiency   Hyponatremia   SECONDARY DIAGNOSIS:   Past Medical History  Diagnosis Date  . COPD (chronic obstructive pulmonary disease) ([place])   . Hypertension   . Melanoma ([place])   . CHF (congestive heart failure) ([place])     .pro HOSPITAL COURSE:  patient is 81 year old Caucasian male with past medical history significant for history of chronic respiratory failure due to pulmonary fibrosis who presented to the hospital with complaints of shortness of breath, minimal cough. According to the admitting physician, he had an acute onset of shortness of breath at around 5 PM on the day of admission and his O2 SATS dropped down to 80s. Chest x-ray on arrival to emergency room revealed advanced emphysema and hyperinflation as well as reticular opacities in both lung bases, which were not changed from before. The patient was admitted to the hospital, initiated on steroids as well as nebulizing treatment, antibiotics and he improved , he was back to his usual oxygen doses which are 5 L of oxygen through nasal cannula. The patient had VQ scan, which was intermediate probability for PE, he underwent US of lower extremities, which was negative for DVT, he underwent CTA of the chest, which was negative for PE or pneumonia. He was felt to be stable to be discharged to home. Discussion by problem: 1. Acute on chronic respiratory failure with  hypoxia, etiology was felt to be COPD exacerbation due to  acute bronchitis, no  PE on chest CTA, no DVT on Doppler US, patient is to continue antibiotics to complete course, steroid taper, nebulizers, he is to follow up with PCP in about 1 week.  2. COPD exacerbation . Continue steroids, inhalation therapy , levofloxacin,  Humibid, improved 3. Acute bronchitis , complete antibiotic therapy with levofloxacin, unable to get sputum cultures.  4. . Hyponatremia , apparently chronic , possibly SIADH. Ordered urine, serum osmolarity, not obtained , but  TSH, as well as cortisol levels were normal, follow up as outpatient. 5.renal insufficiency , rule out obstruction. Bladder scan ordered, not obtained . Improved on IVF.   DISCHARGE CONDITIONS:   fair  CONSULTS OBTAINED:  Treatment Team:  [name], MD  DRUG ALLERGIES:  No Known Allergies  DISCHARGE MEDICATIONS:   Discharge Medication List as of 11/07/2015  4:10 PM    START taking these medications   Details  guaiFENesin (MUCINEX) 600 MG 12 hr tablet Take 1 tablet (600 mg total) by mouth 2 (two) times daily., Starting 11/07/2015, Until Discontinued, Normal    ipratropium-albuterol (DUONEB) 0.5-2.5 (3) MG/3ML SOLN Take 3 mLs by nebulization every 4 (four) hours., Starting 11/07/2015, Until Discontinued, Normal    levofloxacin (LEVAQUIN) 750 MG tablet Take 1 tablet (750 mg total) by mouth daily., Starting 11/07/2015, Until Discontinued, Normal    methylPREDNISolone (MEDROL DOSEPAK) 4 MG TBPK tablet follow package directions, Normal      CONTINUE these medications which have NOT CHANGED  Details  aspirin EC 81 MG tablet Take 81 mg by mouth at bedtime., Until Discontinued, Historical Med    atorvastatin (LIPITOR) 40 MG tablet Take 40 mg by mouth at bedtime. , Until Discontinued, Historical Med    clopidogrel (PLAVIX) 75 MG tablet Take 75 mg by mouth at bedtime. , Until Discontinued, Historical Med    diltiazem (DILACOR XR) 120  MG 24 hr capsule Take 120 mg by mouth daily., Until Discontinued, Historical Med    Fluticasone-Salmeterol (ADVAIR) 250-50 MCG/DOSE AEPB Inhale 1 puff into the lungs 2 (two) times daily., Until Discontinued, Historical Med    Ipratropium-Albuterol (COMBIVENT RESPIMAT) 20-100 MCG/ACT AERS respimat Inhale 1 puff into the lungs every 6 (six) hours as needed for wheezing or shortness of breath., Until Discontinued, Historical Med    isosorbide mononitrate (IMDUR) 30 MG 24 hr tablet Take 30 mg by mouth daily., Until Discontinued, Historical Med    metoprolol succinate (TOPROL-XL) 25 MG 24 hr tablet Take 25 mg by mouth daily., Until Discontinued, Historical Med    predniSONE (DELTASONE) 10 MG tablet Take 1 tablet (10 mg total) by mouth daily with breakfast., Starting 10/08/2015, Until Discontinued, Print    tiotropium (SPIRIVA) 18 MCG inhalation capsule Place 18 mcg into inhaler and inhale daily at 12 noon. , Until Discontinued, Historical Med         DISCHARGE INSTRUCTIONS:    Follow up with PCP within one week  If you experience worsening of your admission symptoms, develop shortness of breath, life threatening emergency, suicidal or homicidal thoughts you must seek medical attention immediately by calling 911 or calling your MD immediately  if symptoms less severe.  You Must read complete instructions/literature along with all the possible adverse reactions/side effects for all the Medicines you take and that have been prescribed to you. Take any new Medicines after you have completely understood and accept all the possible adverse reactions/side effects.   Please note  You were cared for by a hospitalist during your hospital stay. If you have any questions about your discharge medications or the care you received while you were in the hospital after you are discharged, you can call the unit and asked to speak with the hospitalist on call if the hospitalist that took care of you is not  available. Once you are discharged, your primary care physician will handle any further medical issues. Please note that NO REFILLS for any discharge medications will be authorized once you are discharged, as it is imperative that you return to your primary care physician (or establish a relationship with a primary care physician if you do not have one) for your aftercare needs so that they can reassess your need for medications and monitor your lab values.    Today   CHIEF COMPLAINT:   Chief Complaint  Patient presents with  . Shortness of Breath    HISTORY OF PRESENT ILLNESS:  Landrum Daffron  is a 81 y.o. male with a known history of chronic respiratory failure due to pulmonary fibrosis who presented to the hospital with complaints of shortness of breath, minimal cough. According to the admitting physician, he had an acute onset of shortness of breath at around 5 PM on the day of admission and his O2 SATS dropped down to 80s. Chest x-ray on arrival to emergency room revealed advanced emphysema and hyperinflation as well as reticular opacities in both lung bases, which were not changed from before. The patient was admitted to the hospital, initiated on steroids as  well as nebulizing treatment, antibiotics and he improved , he was back to his usual oxygen doses which are 5 L of oxygen through nasal cannula. The patient had VQ scan, which was intermediate probability for PE, he underwent US of lower extremities, which was negative for DVT, he underwent CTA of the chest, which was negative for PE or pneumonia. He was felt to be stable to be discharged to home. Discussion by problem: 1. Acute on chronic respiratory failure with hypoxia, etiology was felt to be COPD exacerbation due to  acute bronchitis, no  PE on chest CTA, no DVT on Doppler US, patient is to continue antibiotics to complete course, steroid taper, nebulizers, he is to follow up with PCP in about 1 week.  2. COPD exacerbation . Continue  steroids, inhalation therapy , levofloxacin,  Humibid, improved 3. Acute bronchitis , complete antibiotic therapy with levofloxacin, unable to get sputum cultures.  4. . Hyponatremia , apparently chronic , possibly SIADH. Ordered urine, serum osmolarity, not obtained , but  TSH, as well as cortisol levels were normal, follow up as outpatient. 5.renal insufficiency , rule out obstruction. Bladder scan ordered, not obtained . Improved on IVF.      VITAL SIGNS:  Blood pressure 118/70, pulse 91, temperature 97.8 F (36.6 C), temperature source Oral, resp. rate 19, height 5\' 10"  (1.778 m), weight 81.647 kg (180 lb), SpO2 93 %.  I/O:  No intake or output data in the 24 hours ending 11/08/15 1936  PHYSICAL EXAMINATION:  GENERAL:  81 y.o.-year-old patient lying in the bed with no acute distress.  EYES: Pupils equal, round, reactive to light and accommodation. No scleral icterus. Extraocular muscles intact.  HEENT: Head atraumatic, normocephalic. Oropharynx and nasopharynx clear.  NECK:  Supple, no jugular venous distention. No thyroid enlargement, no tenderness.  LUNGS: Normal breath sounds bilaterally, no wheezing, rales,rhonchi or crepitation. No use of accessory muscles of respiration.  CARDIOVASCULAR: S1, S2 normal. No murmurs, rubs, or gallops.  ABDOMEN: Soft, non-tender, non-distended. Bowel sounds present. No organomegaly or mass.  EXTREMITIES: No pedal edema, cyanosis, or clubbing.  NEUROLOGIC: Cranial nerves II through XII are intact. Muscle strength 5/5 in all extremities. Sensation intact. Gait not checked.  PSYCHIATRIC: The patient is alert and oriented x 3.  SKIN: No obvious rash, lesion, or ulcer.   DATA REVIEW:   CBC  Recent Labs Lab 11/07/15 0647  WBC 14.8*  HGB 14.9  HCT 45.0  PLT 211    Chemistries   Recent Labs Lab 11/05/15 2341  11/07/15 0647  NA 133*  < > 132*  K 4.3  < > 4.7  CL 102  < > 98*  CO2 21*  < > 26  GLUCOSE 202*  < > 149*  BUN 16  < > 21*   CREATININE 1.01  < > 1.03  CALCIUM 8.9  < > 8.7*  MG 1.6*  --   --   AST 26  --   --   ALT 16*  --   --   ALKPHOS 101  --   --   BILITOT 1.7*  --   --   < > = values in this interval not displayed.  Cardiac Enzymes  Recent Labs Lab 11/05/15 2341  TROPONINI 0.04*    Microbiology Results  Results for orders placed or performed during the hospital encounter of 11/05/15  MRSA PCR Screening     Status: None   Collection Time: 11/06/15  2:40 AM  Result Value Ref Range  Status   MRSA by PCR NEGATIVE NEGATIVE Final    Comment:        The GeneXpert MRSA Assay (FDA approved for NASAL specimens only), is one component of a comprehensive MRSA colonization surveillance program. It is not intended to diagnose MRSA infection nor to guide or monitor treatment for MRSA infections.     RADIOLOGY:  Ct Angio Chest Pe W/cm &/or Wo Cm  11/07/2015  CLINICAL DATA:  Acute shortness of breath. EXAM: CT ANGIOGRAPHY CHEST WITH CONTRAST TECHNIQUE: Multidetector CT imaging of the chest was performed using the standard protocol during bolus administration of intravenous contrast. Multiplanar CT image reconstructions and MIPs were obtained to evaluate the vascular anatomy. CONTRAST:  189mL OMNIPAQUE IOHEXOL 350 MG/ML SOLN COMPARISON:  CT scan of April 18, 2015. FINDINGS: No pneumothorax or pleural effusion is noted. Emphysematous disease is noted throughout both lungs. There is no evidence of pulmonary embolus. Atherosclerosis of thoracic aorta is noted without aneurysm or dissection. No significant mediastinal mass or adenopathy is noted. Coronary artery calcifications are noted suggesting coronary artery disease. Visualized portion of upper abdomen demonstrates bilateral adrenal gland enlargement consistent with hyperplasia. No significant osseous abnormality is noted. Review of the MIP images confirms the above findings. IMPRESSION: No evidence of pulmonary embolus. Atherosclerosis of thoracic aorta is  noted without aneurysm or dissection. Coronary artery calcifications are noted suggesting coronary artery disease. Stable bilateral adrenal gland enlargement is noted suggesting adrenal hyperplasia. Stable emphysematous disease is noted throughout both lungs. Electronically Signed   By: Marijo Conception, M.D.   On: 11/07/2015 12:10   Nm Pulmonary Perf And Vent  11/06/2015  CLINICAL DATA:  81 year old male with acute on chronic respiratory failure and hypoxemia. EXAM: NUCLEAR MEDICINE VENTILATION - PERFUSION LUNG SCAN TECHNIQUE: Ventilation images were obtained in multiple projections using inhaled aerosol Tc-55m DTPA. Perfusion images were obtained in multiple projections after intravenous injection of Tc-61m MAA. RADIOPHARMACEUTICALS:  [id number] millicuries [id number] DTPA aerosol inhalation and 3.9 millicuries [id number] MAA IV COMPARISON:  11/05/2015 and prior chest radiographs. 04/18/2015 and prior CTs. FINDINGS: Ventilation: Multiple areas of decreased ventilation noted. There is central clumping of DTPA present. Perfusion: There are multiple bilateral areas of decreased perfusion which match ventilation defects. IMPRESSION: Intermediate probability of pulmonary embolus (20-79%) Electronically Signed   By: Margarette Canada M.D.   On: 11/06/2015 20:20   US Venous Img Lower Bilateral  11/07/2015  CLINICAL DATA:  History of pulmonary embolism and colon cancer. Evaluate for DVT. EXAM: BILATERAL LOWER EXTREMITY VENOUS DOPPLER ULTRASOUND TECHNIQUE: Gray-scale sonography with graded compression, as well as color Doppler and duplex ultrasound were performed to evaluate the lower extremity deep venous systems from the level of the common femoral vein and including the common femoral, femoral, profunda femoral, popliteal and calf veins including the posterior tibial, peroneal and gastrocnemius veins when visible. The superficial great saphenous vein was also interrogated. Spectral Doppler was utilized to evaluate  flow at rest and with distal augmentation maneuvers in the common femoral, femoral and popliteal veins. COMPARISON:  VQ scan - 11/06/2015; chest CTA - earlier same day FINDINGS: RIGHT LOWER EXTREMITY Common Femoral Vein: No evidence of thrombus. Normal compressibility, respiratory phasicity and response to augmentation. Saphenofemoral Junction: No evidence of thrombus. Normal compressibility and flow on color Doppler imaging. Profunda Femoral Vein: No evidence of thrombus. Normal compressibility and flow on color Doppler imaging. Femoral Vein: No evidence of thrombus. Normal compressibility, respiratory phasicity and response to augmentation. Popliteal Vein: No evidence of thrombus. Normal compressibility, respiratory  phasicity and response to augmentation. Calf Veins: No evidence of thrombus. Normal compressibility and flow on color Doppler imaging. Superficial Great Saphenous Vein: No evidence of thrombus. Normal compressibility and flow on color Doppler imaging. Venous Reflux:  None. Other Findings: Pulsatile flow was demonstrated throughout the right lower extremity venous system. LEFT LOWER EXTREMITY Common Femoral Vein: No evidence of thrombus. Normal compressibility, respiratory phasicity and response to augmentation. Saphenofemoral Junction: No evidence of thrombus. Normal compressibility and flow on color Doppler imaging. Profunda Femoral Vein: No evidence of thrombus. Normal compressibility and flow on color Doppler imaging. Femoral Vein: No evidence of thrombus. Normal compressibility, respiratory phasicity and response to augmentation. Popliteal Vein: No evidence of thrombus. Normal compressibility, respiratory phasicity and response to augmentation. Calf Veins: No evidence of thrombus. Normal compressibility and flow on color Doppler imaging. Superficial Great Saphenous Vein: No evidence of thrombus. Normal compressibility and flow on color Doppler imaging. Venous Reflux:  None. Other Findings:  Pulsatile flow was demonstrated throughout the left lower extremity venous system. IMPRESSION: 1. No evidence of DVT within either lower extremity. 2. Pulsatile flow was demonstrated throughout the bilateral lower extremity venous systems, nonspecific though could be seen in the setting of right-sided heart failure. Clinical correlation is advised. Electronically Signed   By: Sandi Mariscal M.D.   On: 11/07/2015 11:54    EKG:   Orders placed or performed during the hospital encounter of 11/05/15  . ED EKG  . ED EKG      Management plans discussed with the patient, family and they are in agreement.  CODE STATUS:   TOTAL TIME TAKING CARE OF THIS PATIENT: 40 minutes.    Theodoro Grist M.D on 11/08/2015 at 7:36 PM  Between 7am to 6pm - Pager - [phone]  After 6pm go to [email or web address] - password [email or web address] [facility]  [place] [facility]  Office  [phone]  CC: Primary care physician; [facility], Chrissie Noa, MD

## 2016-04-01 IMAGING — CR DG CHEST 1V PORT
1 series · 1 of 1 positions shown · non-contrast
Comparison: Radiographs 10/07/2015.  Chest CT 04/18/2015

CLINICAL DATA: Progressive shortness of breath for 6 hours.

EXAM:
PORTABLE CHEST 1 VIEW

[ap]
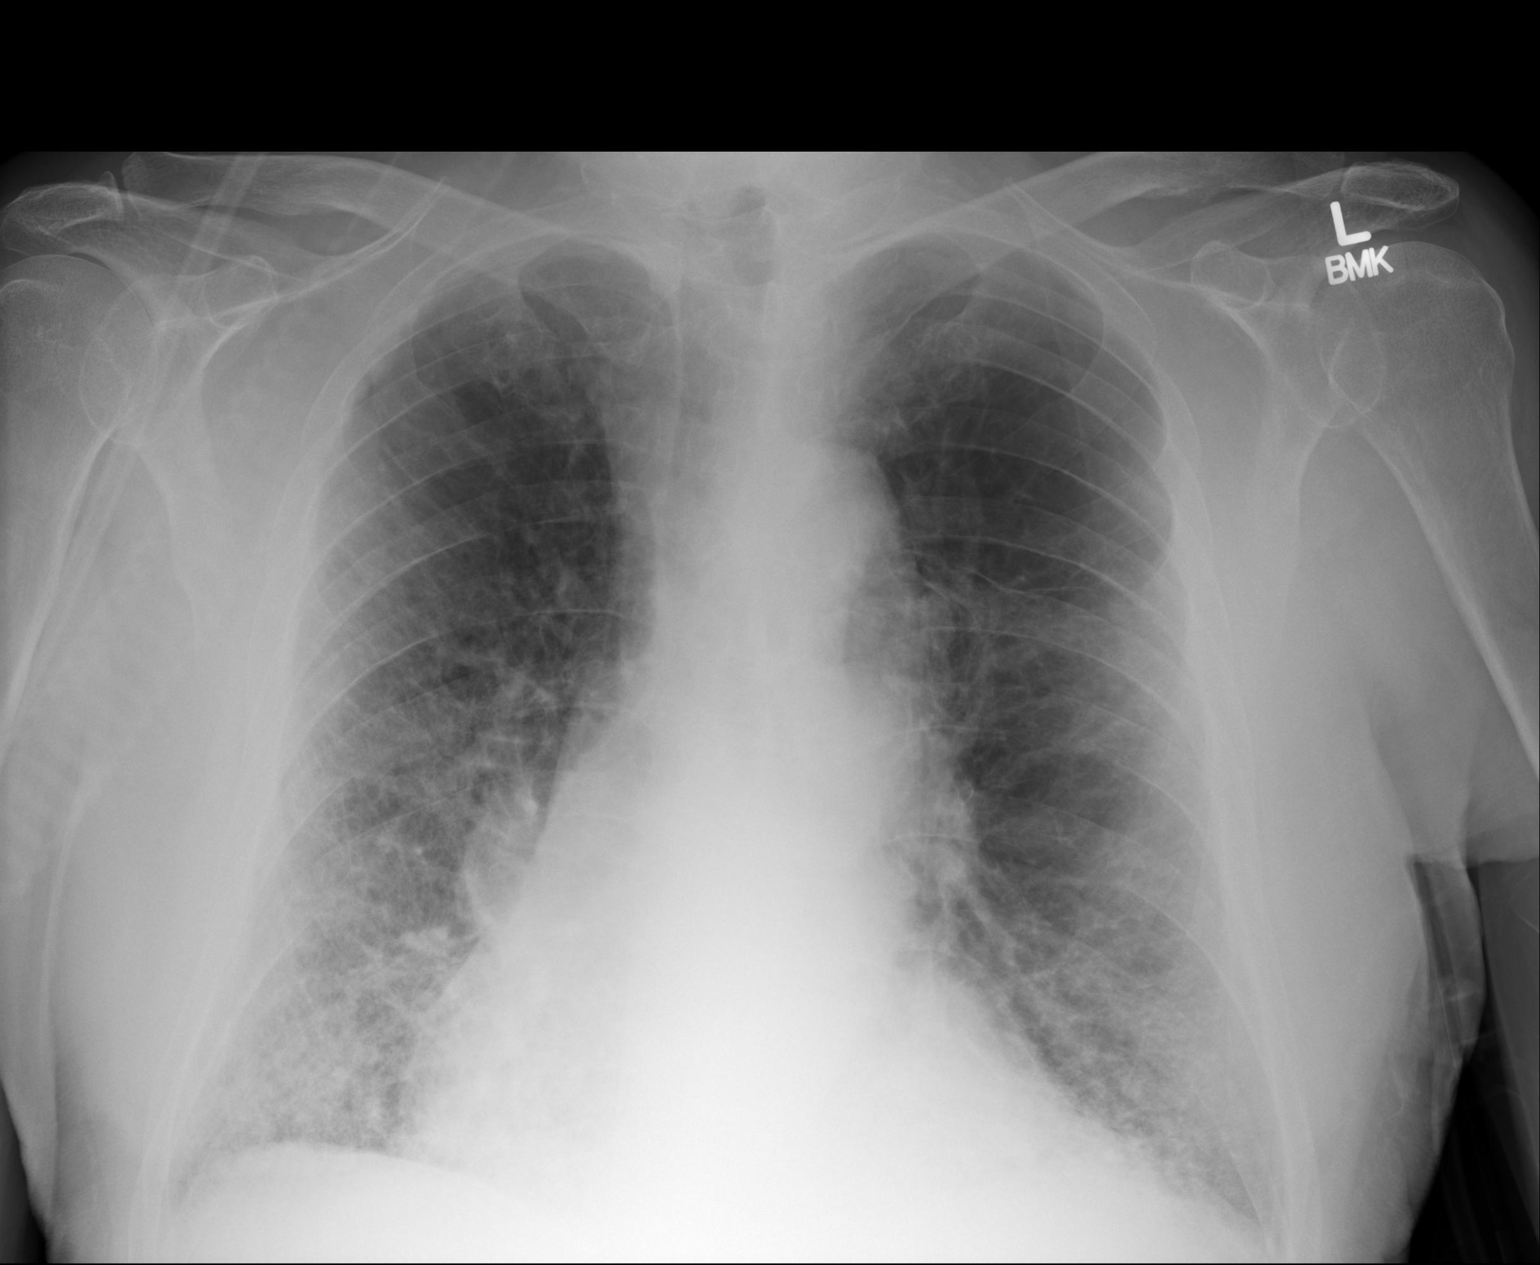

[1 of 1 positions shown; findings below may reference images not displayed]

FINDINGS: Lungs hyperinflated with emphysema. Cardiomegaly and mediastinal
contours are unchanged. Reticular opacities at both lung bases are
unchanged, favor fibrosis. Pulmonary edema is considered but felt
less likely. No confluent airspace disease. No pneumothorax. No
large pleural effusion.
IMPRESSION: 1. Advanced emphysema and hyperinflation.
2. Reticular opacities at both lung bases are unchanged from prior
exams, fibrosis and chronic interstitial lung disease is favored
over edema or recurrent infection.

## 2016-04-02 IMAGING — NM NM PULMONARY VENT & PERF
2 series · 16 of 16 positions shown · non-contrast
Comparison: 11/05/2015 and prior chest radiographs. 04/18/2015 and
prior CTs.

CLINICAL DATA: 86-year-old male with acute on chronic respiratory
failure and hypoxemia.

EXAM:
NUCLEAR MEDICINE VENTILATION - PERFUSION LUNG SCAN
TECHNIQUE: Ventilation images were obtained in multiple projections using
inhaled aerosol 3c-22m DTPA. Perfusion images were obtained in
multiple projections after intravenous injection of 3c-22m MAA.
RADIOPHARMACEUTICALS:  41.0 millicuries Hechnetium-YYm DTPA aerosol
inhalation and 3.9 millicuries Hechnetium-YYm MAA IV

[Series 1000: lung perfusion · 1.95mm/px · 4 acquisitions, 8 frames shown]
[im 1/4]
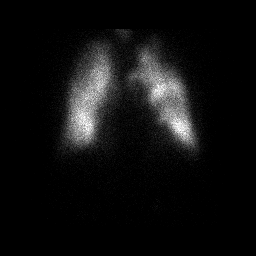
[im 1/4]
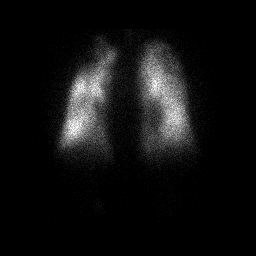
[im 2/4]
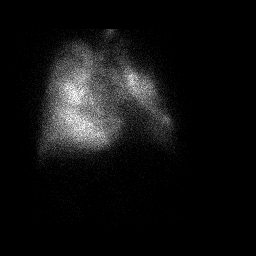
[im 2/4]
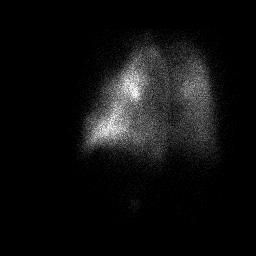
[im 3/4]
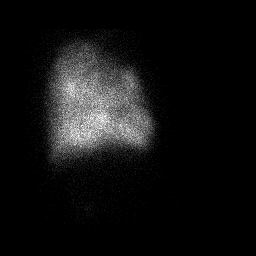
[im 3/4]
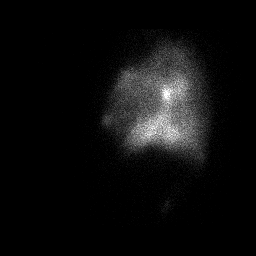
[im 4/4]
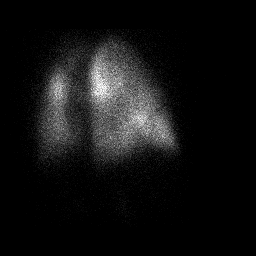
[im 4/4]
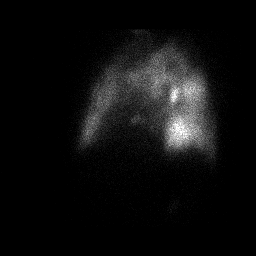

[Series 1000: lung ventilation · 3.90mm/px · 4 acquisitions, 8 frames shown]
[im 1/4]
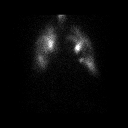
[im 1/4]
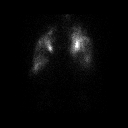
[im 2/4]
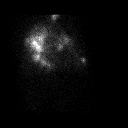
[im 2/4]
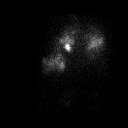
[im 3/4]
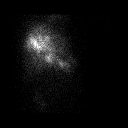
[im 3/4]
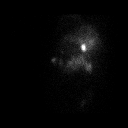
[im 4/4]
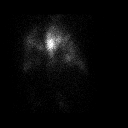
[im 4/4]
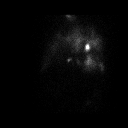

[16 of 16 positions shown; findings below may reference images not displayed]

FINDINGS: Ventilation: Multiple areas of decreased ventilation noted. There is
central clumping of DTPA present.

Perfusion: There are multiple bilateral areas of decreased perfusion
which match ventilation defects.
IMPRESSION: Intermediate probability of pulmonary embolus (20-79%)

## 2016-04-03 IMAGING — CT CT ANGIO CHEST
1 of 2 series · 18 of 30 positions shown · IV contrast (APPLIED)
Comparison: CT scan of April 18, 2015.

CLINICAL DATA: Acute shortness of breath.

EXAM:
CT ANGIOGRAPHY CHEST WITH CONTRAST
TECHNIQUE: Multidetector CT imaging of the chest was performed using the
standard protocol during bolus administration of intravenous
contrast. Multiplanar CT image reconstructions and MIPs were
obtained to evaluate the vascular anatomy.
CONTRAST:  100mL OMNIPAQUE IOHEXOL 350 MG/ML SOLN

[Series 7: pe 1.0 thins · axial · 0.78mm/px · z∈[-726,-428]mm · 18 of 337 slices shown]
[im 19/337  lung]
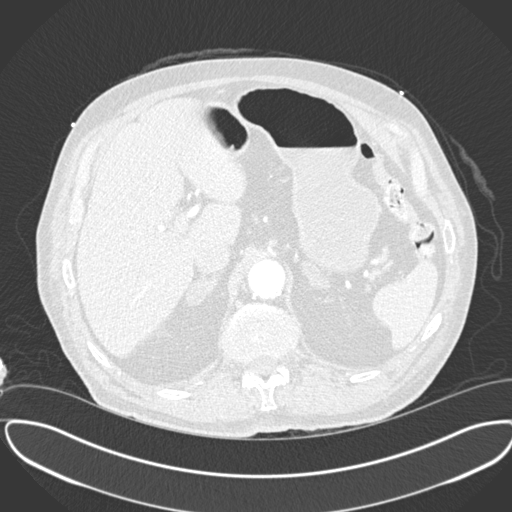
[im 38/337  mediastinal]
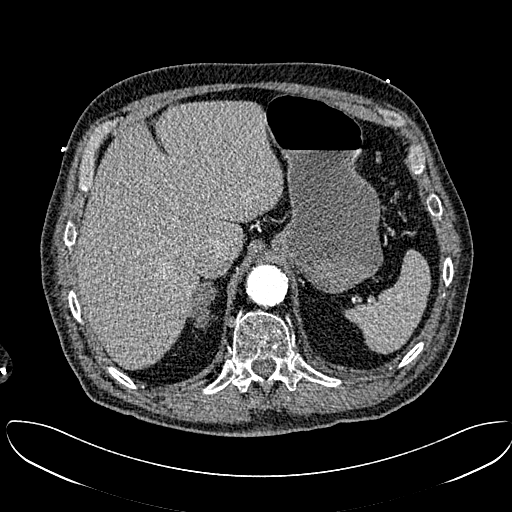
[im 57/337  lung]
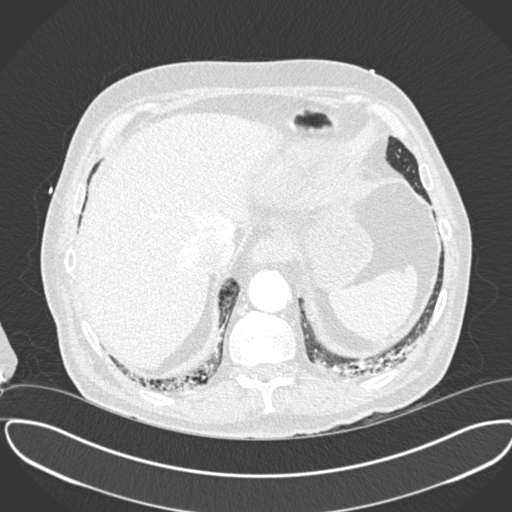
[im 75/337  mediastinal]
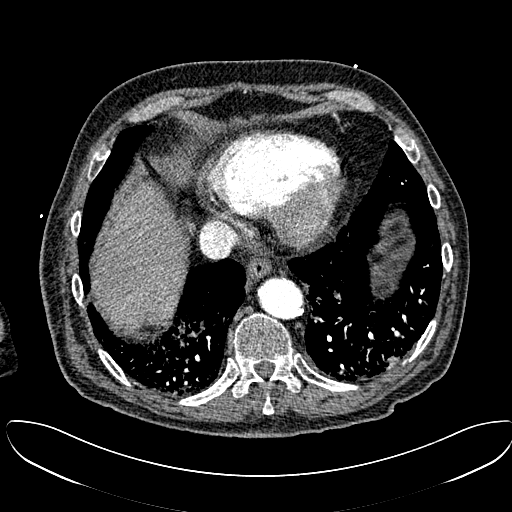
[im 94/337  lung]
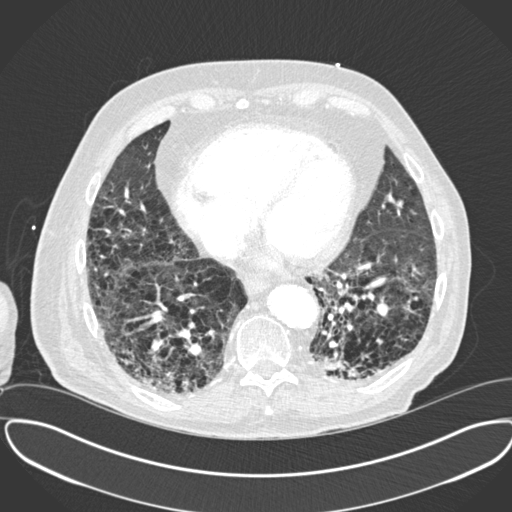
[im 113/337  mediastinal]
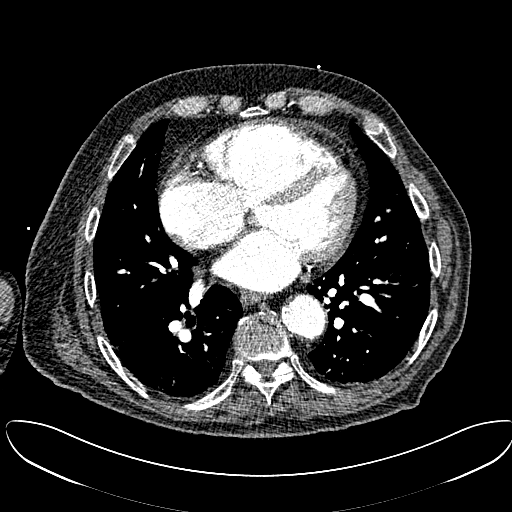
[im 131/337  lung]
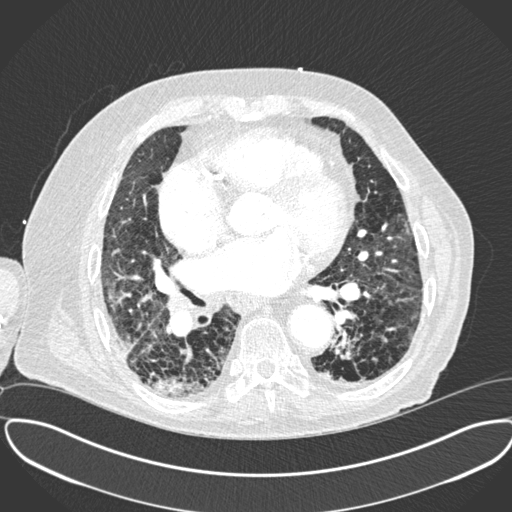
[im 150/337  mediastinal]
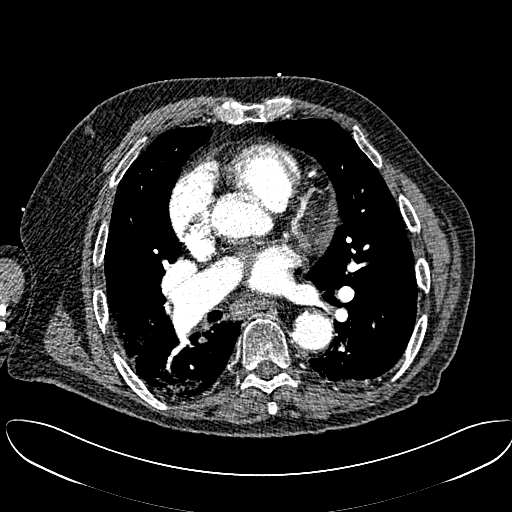
[im 159/337  lung]
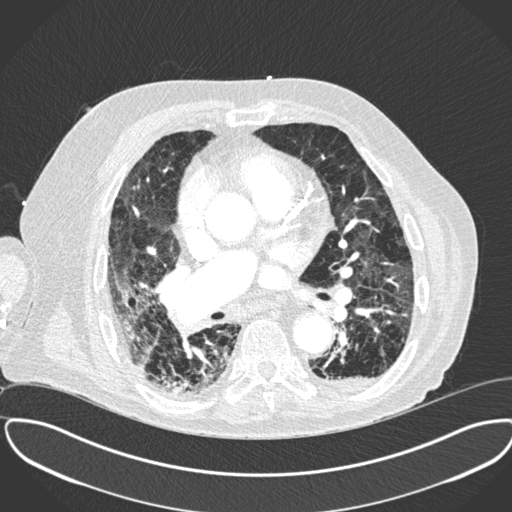
[im 169/337  mediastinal]
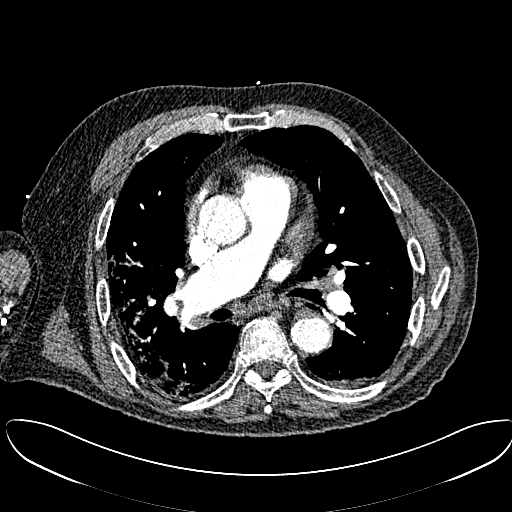
[im 187/337  lung]
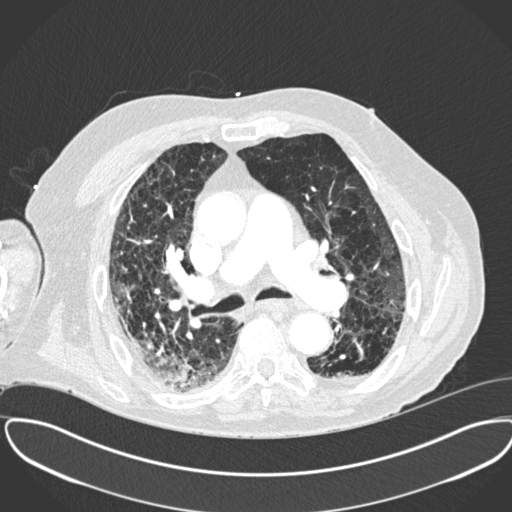
[im 206/337  mediastinal]
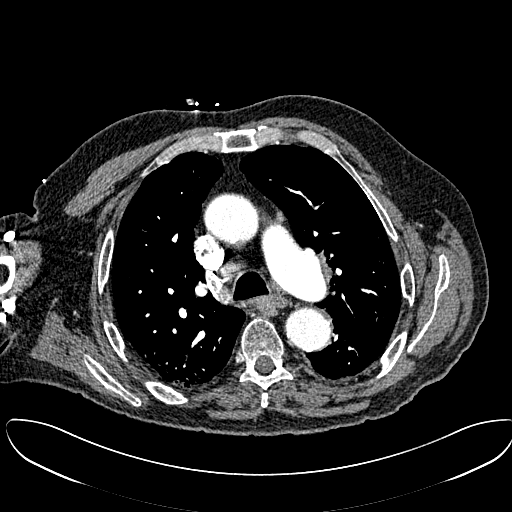
[im 225/337  lung]
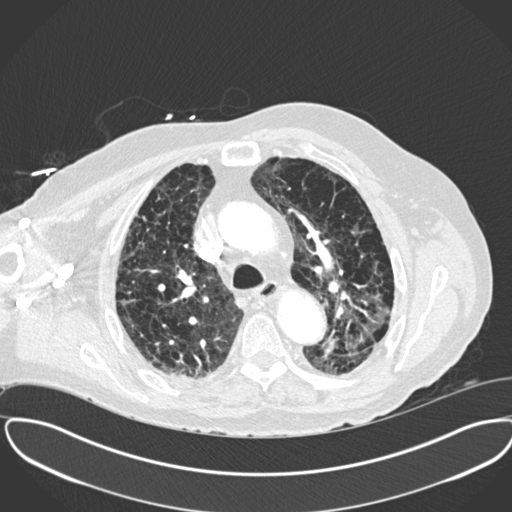
[im 243/337  mediastinal]
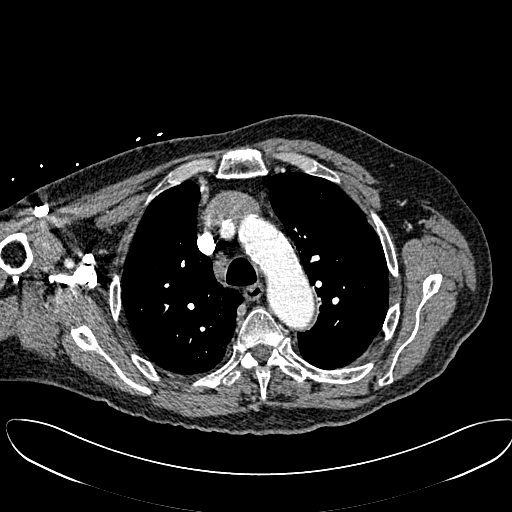
[im 262/337  lung]
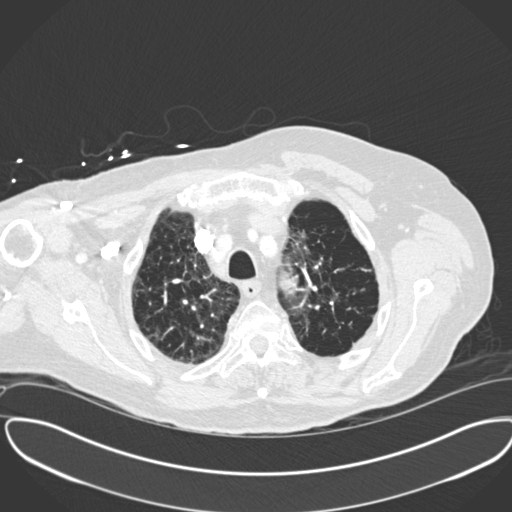
[im 281/337  mediastinal]
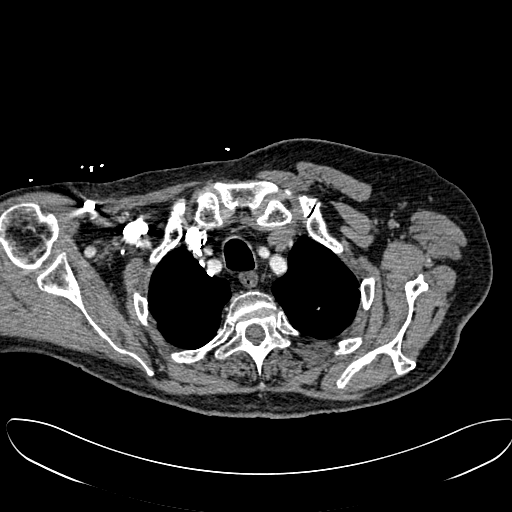
[im 299/337  lung]
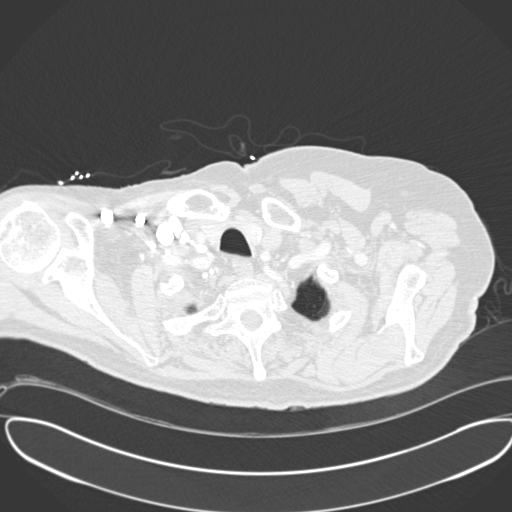
[im 318/337  mediastinal]
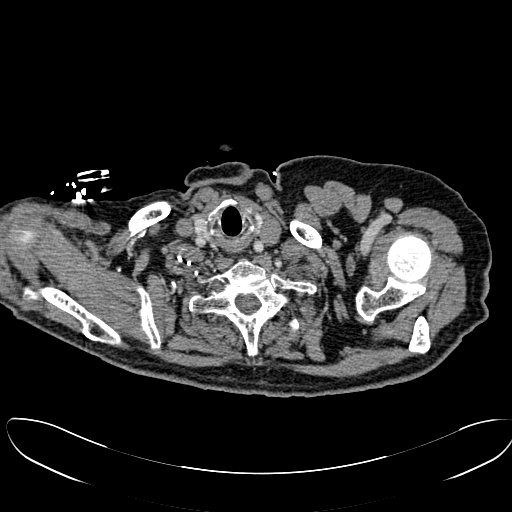

[18 of 30 positions shown; findings below may reference images not displayed]

FINDINGS: No pneumothorax or pleural effusion is noted. Emphysematous disease
is noted throughout both lungs. There is no evidence of pulmonary
embolus. Atherosclerosis of thoracic aorta is noted without aneurysm
or dissection. No significant mediastinal mass or adenopathy is
noted. Coronary artery calcifications are noted suggesting coronary
artery disease. Visualized portion of upper abdomen demonstrates
bilateral adrenal gland enlargement consistent with hyperplasia. No
significant osseous abnormality is noted.

Review of the MIP images confirms the above findings.
IMPRESSION: No evidence of pulmonary embolus.

Atherosclerosis of thoracic aorta is noted without aneurysm or
dissection.

Coronary artery calcifications are noted suggesting coronary artery
disease.

Stable bilateral adrenal gland enlargement is noted suggesting
adrenal hyperplasia.

Stable emphysematous disease is noted throughout both lungs.

## 2016-06-01 ENCOUNTER — Emergency Department: Payer: Medicare Other

## 2016-06-01 ENCOUNTER — Emergency Department
Admission: EM | Admit: 2016-06-01 | Discharge: 2016-06-01 | Disposition: A | Discharge status: Home / Self Care | Payer: Medicare Other | Attending: Emergency Medicine | Admitting: Emergency Medicine

## 2016-06-01 ENCOUNTER — Encounter: Payer: Self-pay | Admitting: Emergency Medicine

## 2016-06-01 DIAGNOSIS — Z79899 Other long term (current) drug therapy: Secondary | ICD-10-CM | POA: Diagnosis not present

## 2016-06-01 DIAGNOSIS — Z85820 Personal history of malignant melanoma of skin: Secondary | ICD-10-CM | POA: Diagnosis not present

## 2016-06-01 DIAGNOSIS — J440 Chronic obstructive pulmonary disease with acute lower respiratory infection: Secondary | ICD-10-CM | POA: Diagnosis not present

## 2016-06-01 DIAGNOSIS — I5031 Acute diastolic (congestive) heart failure: Secondary | ICD-10-CM | POA: Insufficient documentation

## 2016-06-01 DIAGNOSIS — Z87891 Personal history of nicotine dependence: Secondary | ICD-10-CM | POA: Insufficient documentation

## 2016-06-01 DIAGNOSIS — Z7982 Long term (current) use of aspirin: Secondary | ICD-10-CM | POA: Diagnosis not present

## 2016-06-01 DIAGNOSIS — R0602 Shortness of breath: Secondary | ICD-10-CM | POA: Diagnosis present

## 2016-06-01 DIAGNOSIS — J209 Acute bronchitis, unspecified: Secondary | ICD-10-CM

## 2016-06-01 DIAGNOSIS — J441 Chronic obstructive pulmonary disease with (acute) exacerbation: Secondary | ICD-10-CM | POA: Insufficient documentation

## 2016-06-01 DIAGNOSIS — I110 Hypertensive heart disease with heart failure: Secondary | ICD-10-CM | POA: Insufficient documentation

## 2016-06-01 DIAGNOSIS — J9801 Acute bronchospasm: Secondary | ICD-10-CM | POA: Insufficient documentation

## 2016-06-01 LAB — CBC WITH DIFFERENTIAL/PLATELET
BASOS ABS: 0 10*3/uL (ref 0–0.1)
BASOS PCT: 1 %
EOS ABS: 0.3 10*3/uL (ref 0–0.7)
Eosinophils Relative: 3 %
HCT: 51.1 % (ref 40.0–52.0)
HEMOGLOBIN: 16.7 g/dL (ref 13.0–18.0)
Lymphocytes Relative: 24 %
Lymphs Abs: 2.3 10*3/uL (ref 1.0–3.6)
MCH: 28 pg (ref 26.0–34.0)
MCHC: 32.7 g/dL (ref 32.0–36.0)
MCV: 85.8 fL (ref 80.0–100.0)
Monocytes Absolute: 1.1 10*3/uL — ABNORMAL HIGH (ref 0.2–1.0)
Monocytes Relative: 11 %
NEUTROS ABS: 5.9 10*3/uL (ref 1.4–6.5)
NEUTROS PCT: 61 %
Platelets: 214 10*3/uL (ref 150–440)
RBC: 5.96 MIL/uL — ABNORMAL HIGH (ref 4.40–5.90)
RDW: 16.3 % — ABNORMAL HIGH (ref 11.5–14.5)
WBC: 9.6 10*3/uL (ref 3.8–10.6)

## 2016-06-01 LAB — BRAIN NATRIURETIC PEPTIDE: B NATRIURETIC PEPTIDE 5: 1794 pg/mL — AB (ref 0.0–100.0)

## 2016-06-01 LAB — COMPREHENSIVE METABOLIC PANEL
ALK PHOS: 72 U/L (ref 38–126)
ALT: 17 U/L (ref 17–63)
ANION GAP: 13 (ref 5–15)
AST: 24 U/L (ref 15–41)
Albumin: 4.3 g/dL (ref 3.5–5.0)
BILIRUBIN TOTAL: 2.4 mg/dL — AB (ref 0.3–1.2)
BUN: 29 mg/dL — ABNORMAL HIGH (ref 6–20)
CALCIUM: 9.4 mg/dL (ref 8.9–10.3)
CO2: 23 mmol/L (ref 22–32)
Chloride: 99 mmol/L — ABNORMAL LOW (ref 101–111)
Creatinine, Ser: 1.25 mg/dL — ABNORMAL HIGH (ref 0.61–1.24)
GFR, EST AFRICAN AMERICAN: 59 mL/min — AB (ref >60)
GFR, EST NON AFRICAN AMERICAN: 51 mL/min — AB (ref >60)
Glucose, Bld: 156 mg/dL — ABNORMAL HIGH (ref 65–99)
Potassium: 4.5 mmol/L (ref 3.5–5.1)
SODIUM: 135 mmol/L (ref 135–145)
TOTAL PROTEIN: 7.3 g/dL (ref 6.5–8.1)

## 2016-06-01 MED ORDER — PREDNISONE 20 MG PO TABS: 40.0000 mg | ORAL_TABLET | Freq: Every day | ORAL | Status: AC

## 2016-06-01 MED ORDER — IPRATROPIUM-ALBUTEROL 0.5-2.5 (3) MG/3ML IN SOLN
3.0000 mL | Freq: Once | RESPIRATORY_TRACT | Status: AC
  Administered 2016-06-01: 3 mL via RESPIRATORY_TRACT
  Filled 2016-06-01: qty 3

## 2016-06-01 MED ORDER — PREDNISONE 20 MG PO TABS
60.0000 mg | ORAL_TABLET | Freq: Once | ORAL | Status: AC
  Administered 2016-06-01: 60 mg via ORAL
  Filled 2016-06-01: qty 3

## 2016-06-01 MED ORDER — FUROSEMIDE 40 MG PO TABS
20.0000 mg | ORAL_TABLET | Freq: Once | ORAL | Status: AC
  Administered 2016-06-01: 20 mg via ORAL
  Filled 2016-06-01: qty 1

## 2016-06-01 NOTE — ED Notes (Signed)
Pt oxygen turned down from 10L to 8L on non-rebreather. Pt maintaining oxygen levels at 98%.

## 2016-06-01 NOTE — ED Notes (Signed)
Upon placing pt on NRP 15L, pt came up to 99%. Pt weaned down to 10L on NRP. Pt at 97%. Will continue to wean down.

## 2016-06-01 NOTE — ED Provider Notes (Signed)
Time Seen: Approximately *1808  I have reviewed the triage notes  Chief Complaint: Shortness of Breath   History of Present Illness: Brett Burton is a 82 y.o. male who has a history of COPD and congestive heart failure. Patient's COPD is rather severe and is currently on a chronic 5 L nasal cannula at home. The patient denies any fever or chills or productive cough has actually been doing well with serial PTs since November. Patient states he recently used was stopped on prednisone approximately 10 days ago. He states over the last 3 days he's had gradual increase in shortness of breath. Denies any chest pain or productive nature to his cough. Patient denies any hemoptysis or wheezing. He denies any new peripheral edema or calf tenderness. Patient was transported here by private vehicle and had O2 sats of 67% on 4 L and was placed on 100% nonrebreather with improvement. Past Medical History  Diagnosis Date  . COPD (chronic obstructive pulmonary disease) ([place])   . Hypertension   . Melanoma ([place])   . CHF (congestive heart failure) [facility])     Patient Active Problem List   Diagnosis Date Noted  . Acute bronchitis 11/07/2015  . Renal insufficiency 11/07/2015  . Hyponatremia 11/07/2015  . Elevated troponin 11/07/2015  . Leukocytosis 11/07/2015  . Acute on chronic respiratory failure ([place]) 10/07/2015  . Acute diastolic CHF (congestive heart failure) ([place]) 04/24/2015  . COPD exacerbation ([place]) 04/24/2015  . Acute on chronic respiratory failure with hypoxemia ([place]) 04/19/2015    Past Surgical History  Procedure Laterality Date  . Colon surgery    . Tonsillectomy    . Appendectomy      Past Surgical History  Procedure Laterality Date  . Colon surgery    . Tonsillectomy    . Appendectomy      Current Outpatient Rx  Name  Route  Sig  Dispense  Refill  . aspirin EC 81 MG tablet   Oral   Take 81 mg by mouth at bedtime.         Marland Kitchen atorvastatin (LIPITOR) 40 MG tablet   Oral  Take 40 mg by mouth at bedtime.          . clopidogrel (PLAVIX) 75 MG tablet   Oral   Take 75 mg by mouth at bedtime.          Marland Kitchen diltiazem (DILACOR XR) 120 MG 24 hr capsule   Oral   Take 120 mg by mouth daily.         . Fluticasone-Salmeterol (ADVAIR) 250-50 MCG/DOSE AEPB   Inhalation   Inhale 1 puff into the lungs 2 (two) times daily.         Marland Kitchen guaiFENesin (MUCINEX) 600 MG 12 hr tablet   Oral   Take 1 tablet (600 mg total) by mouth 2 (two) times daily.   20 tablet   0   . Ipratropium-Albuterol (COMBIVENT RESPIMAT) 20-100 MCG/ACT AERS respimat   Inhalation   Inhale 1 puff into the lungs every 6 (six) hours as needed for wheezing or shortness of breath.         Marland Kitchen ipratropium-albuterol (DUONEB) 0.5-2.5 (3) MG/3ML SOLN   Nebulization   Take 3 mLs by nebulization every 4 (four) hours.   360 mL   6   . isosorbide mononitrate (IMDUR) 30 MG 24 hr tablet   Oral   Take 30 mg by mouth daily.         . metoprolol succinate (TOPROL-XL) 25  MG 24 hr tablet   Oral   Take 25 mg by mouth daily.         . predniSONE (DELTASONE) 20 MG tablet   Oral   Take 2 tablets (40 mg total) by mouth daily.   10 tablet   0   . tiotropium (SPIRIVA) 18 MCG inhalation capsule   Inhalation   Place 18 mcg into inhaler and inhale daily at 12 noon.            Allergies:  Review of patient's allergies indicates no known allergies.  Family History: Family History  Problem Relation Age of Onset  . Hypertension Other     Social History: Social History  Substance Use Topics  . Smoking status: Former Research scientist (life sciences)  . Smokeless tobacco: None  . Alcohol Use: No     Review of Systems:   10 point review of systems was performed and was otherwise negative:  Constitutional: No fever Eyes: No visual disturbances ENT: No sore throat, ear pain Cardiac: No chest pain Respiratory:Shortness of breath but no obvious audible wheezing or stridor at home Abdomen: No abdominal pain, no  vomiting, No diarrhea Endocrine: No weight loss, No night sweats Extremities: No peripheral edema, cyanosis Skin: No rashes, easy bruising Neurologic: No focal weakness, trouble with speech or swollowing Urologic: No dysuria, Hematuria, or urinary frequency    Physical Exam:  ED Triage Vitals  Enc Vitals Group     BP 06/01/16 1805 131/118 mmHg     Pulse Rate 06/01/16 1905 81     Resp 06/01/16 1805 24     Temp 06/01/16 1805 97.9 F (36.6 C)     Temp src --      SpO2 06/01/16 1805 93 %     Weight 06/01/16 1805 175 lb (79.379 kg)     Height 06/01/16 1805 5\' 10"  (1.778 m)     Head Cir --      [place] Flow --      Pain Score 06/01/16 2210 0     Pain Loc --      Pain Edu? --      Excl. in [place]? --     General: Awake , Alert , and Oriented times 3; GCS 15No apparent respiratory distress and speaks in interrupted sentences Head: Normal cephalic , atraumatic Eyes: Pupils equal , round, reactive to light Nose/Throat: No nasal drainage, patent upper airway without erythema or exudate.  Neck: Supple, Full range of motion, No anterior adenopathy or palpable thyroid masses Lungs: Overall diminished breath sounds at the bases with some mild and expiratory wheezes heard bilaterally at the apices with no rhonchi or rales Heart: Regular rate, regular rhythm without murmurs , gallops , or rubs Abdomen: Soft, non tender without rebound, guarding , or rigidity; bowel sounds positive and symmetric in all 4 quadrants. No organomegaly .        Extremities: 2 plus symmetric pulses. No edema, clubbing or cyanosis Neurologic: normal ambulation, Motor symmetric without deficits, sensory intact Skin: warm, dry, no rashes   Labs:   All laboratory work was reviewed including any pertinent negatives or positives listed below:  Labs Reviewed  COMPREHENSIVE METABOLIC PANEL - Abnormal; Notable for the following:    Chloride 99 (*)    Glucose, Bld 156 (*)    BUN 29 (*)    Creatinine, Ser 1.25 (*)    Total  Bilirubin 2.4 (*)    GFR calc non Af Amer 51 (*)    GFR calc Af  Amer 59 (*)    All other components within normal limits  CBC WITH DIFFERENTIAL/PLATELET - Abnormal; Notable for the following:    RBC 5.96 (*)    RDW 16.3 (*)    Monocytes Absolute 1.1 (*)    All other components within normal limits  BRAIN NATRIURETIC PEPTIDE - Abnormal; Notable for the following:    B Natriuretic Peptide 1794.0 (*)    All other components within normal limits  Patient's BNP is elevated.  EKG: *  ED ECG REPORT I, Daymon Larsen, the attending physician, personally viewed and interpreted this ECG.  Date: 06/01/2016 EKG Time: 1801 Rate: 106 Rhythm: Sinus tachycardia QRS Axis: normal Intervals: Right bundle-branch block ST/T Wave abnormalities: Nonspecific T wave abnormality Conduction Disturbances: none Narrative Interpretation: unremarkable No acute ischemic changes are noted   Radiology:  [facility] Chest Port 1 View (Final result) Result time: 06/01/16 18:39:02   Final result by Rad Results In Interface (06/01/16 18:39:02)   Narrative:   CLINICAL DATA: Increasing shortness of breath  EXAM: PORTABLE CHEST 1 VIEW  COMPARISON: 11/05/2015  FINDINGS: Advanced emphysema with hyperinflation and apical bullae. Interstitial coarsening at the bases is stable or improved. Chronic cardiomegaly and aortic tortuosity. No effusion or pneumothorax.  IMPRESSION: Stable or improved since prior. Severe emphysema without evidence of acute superimposed disease.   Electronically Signed By: Monte Fantasia M.D. On: 06/01/2016 18:39        * I personally reviewed the radiologic studies     ED Course:  Patient's stay here showed symptomatic improvement his BNP was slightly elevated surgery received 20 mg of IV Lasix. He also was given a DuoNeb and was started on steroid therapy which she states is given him very good improvement in the past. Patient's pulse ox remained at 94% on a 5 L nasal  cannula which is at his baseline. She will be discharged with follow-up to his pulmonologist and/or his primary physician.      Final Clinical Impression: *  Final diagnoses:  Acute bronchitis with bronchospasm     Plan: * Outpatient management Patient was advised to return immediately if condition worsens. Patient was advised to follow up with their primary care physician or other specialized physicians involved in their outpatient care. The patient and/or family member/power of attorney had laboratory results reviewed at the bedside. All questions and concerns were addressed and appropriate discharge instructions were distributed by the nursing staff. Patient was discharged with a bolus prescription for steroids and was advised continue with his inhaler nebulizers at home. I felt antibiotics were not necessary at this time.           Daymon Larsen, MD 06/01/16 [phone]

## 2016-06-01 NOTE — ED Notes (Signed)
Pt via ems from home with increasing sob today. States he is always sob, but it is worse today. Pt O2 sat 67% on 4L in triage. Pt placed on NRB mask.

## 2016-06-01 NOTE — ED Notes (Signed)
Pt taken off NRP and placed on [place] 6L. Will continue to monitor 02 saturation. Pt home oxygen is 5L [place].

## 2016-06-01 NOTE — ED Notes (Signed)
Pt O2 sats 98-100% on NRB; attempted to wean to [place] at 5L, which is what pt uses at home. Pt sats dropped to 85-88%. NRB mask replaced; will attempt weaning later

## 2016-06-01 NOTE — ED Notes (Signed)
Pt became SOB when getting dressed. Pt made sure oxygen was turned on and took some deep breaths. After a few minutes pt was no longer SOB and pt stated he was ready to go home. Pt verbalized he did not want to stay.

## 2016-06-01 NOTE — Discharge Instructions (Signed)
Acute Bronchitis Bronchitis is inflammation of the airways that extend from the windpipe into the lungs (bronchi). The inflammation often causes mucus to develop. This leads to a cough, which is the most common symptom of bronchitis.  In acute bronchitis, the condition usually develops suddenly and goes away over time, usually in a couple weeks. Smoking, allergies, and asthma can make bronchitis worse. Repeated episodes of bronchitis may cause further lung problems.  CAUSES Acute bronchitis is most often caused by the same virus that causes a cold. The virus can spread from person to person (contagious) through coughing, sneezing, and touching contaminated objects. SIGNS AND SYMPTOMS   Cough.   Fever.   Coughing up mucus.   Body aches.   Chest congestion.   Chills.   Shortness of breath.   Sore throat.  DIAGNOSIS  Acute bronchitis is usually diagnosed through a physical exam. Your health care provider will also ask you questions about your medical history. Tests, such as chest X-rays, are sometimes done to rule out other conditions.  TREATMENT  Acute bronchitis usually goes away in a couple weeks. Oftentimes, no medical treatment is necessary. Medicines are sometimes given for relief of fever or cough. Antibiotic medicines are usually not needed but may be prescribed in certain situations. In some cases, an inhaler may be recommended to help reduce shortness of breath and control the cough. A cool mist vaporizer may also be used to help thin bronchial secretions and make it easier to clear the chest.  HOME CARE INSTRUCTIONS  Get plenty of rest.   Drink enough fluids to keep your urine clear or pale yellow (unless you have a medical condition that requires fluid restriction). Increasing fluids may help thin your respiratory secretions (sputum) and reduce chest congestion, and it will prevent dehydration.   Take medicines only as directed by your health care provider.  If  you were prescribed an antibiotic medicine, finish it all even if you start to feel better.  Avoid smoking and secondhand smoke. Exposure to cigarette smoke or irritating chemicals will make bronchitis worse. If you are a smoker, consider using nicotine gum or skin patches to help control withdrawal symptoms. Quitting smoking will help your lungs heal faster.   Reduce the chances of another bout of acute bronchitis by washing your hands frequently, avoiding people with cold symptoms, and trying not to touch your hands to your mouth, nose, or eyes.   Keep all follow-up visits as directed by your health care provider.  SEEK MEDICAL CARE IF: Your symptoms do not improve after 1 week of treatment.  SEEK IMMEDIATE MEDICAL CARE IF:  You develop an increased fever or chills.   You have chest pain.   You have severe shortness of breath.  You have bloody sputum.   You develop dehydration.  You faint or repeatedly feel like you are going to pass out.  You develop repeated vomiting.  You develop a severe headache. MAKE SURE YOU:   Understand these instructions.  Will watch your condition.  Will get help right away if you are not doing well or get worse.   This information is not intended to replace advice given to you by your health care provider. Make sure you discuss any questions you have with your health care provider.   Document Released: 01/13/2005 Document Revised: 12/27/2014 Document Reviewed: 05/29/2013 Elsevier Interactive Patient Education [employer].   Please return immediately if condition worsens. Please contact her primary physician or the physician you were given  for referral. If you have any specialist physicians involved in her treatment and plan please also contact them. Thank you for using [facility] emergency [facility]. Return to emergency department especially for fever, productive cough, chest pain, increased shortness of breath or any other  new concerns.

## 2016-07-20 DEATH — deceased
